# Patient Record
Sex: Female | Born: 1970 | Race: White | Hispanic: No | Marital: Married | Smoking: Former smoker
Health system: Southern US, Community
[De-identification: ages and names within clinical notes are randomized; demographics above are authoritative.]

## PROBLEM LIST (undated history)

## (undated) DIAGNOSIS — N920 Excessive and frequent menstruation with regular cycle: Secondary | ICD-10-CM

## (undated) DIAGNOSIS — K900 Celiac disease: Secondary | ICD-10-CM

## (undated) HISTORY — DX: Celiac disease: K90.0

## (undated) HISTORY — PX: WISDOM TOOTH EXTRACTION: SHX21

---

## 1998-05-22 ENCOUNTER — Other Ambulatory Visit: Admission: RE | Admit: 1998-05-22 | Discharge: 1998-05-22 | Payer: Self-pay | Admitting: Gynecology

## 1999-01-19 ENCOUNTER — Inpatient Hospital Stay (HOSPITAL_COMMUNITY): Admission: AD | Admit: 1999-01-19 | Discharge: 1999-01-21 | Payer: Self-pay | Admitting: Obstetrics & Gynecology

## 1999-01-22 ENCOUNTER — Encounter (HOSPITAL_COMMUNITY): Admission: RE | Admit: 1999-01-22 | Discharge: 1999-04-22 | Payer: Self-pay | Admitting: Obstetrics and Gynecology

## 2000-12-29 ENCOUNTER — Other Ambulatory Visit: Admission: RE | Admit: 2000-12-29 | Discharge: 2000-12-29 | Payer: Self-pay | Admitting: Obstetrics and Gynecology

## 2001-08-18 ENCOUNTER — Encounter: Payer: Self-pay | Admitting: Obstetrics and Gynecology

## 2001-08-18 ENCOUNTER — Inpatient Hospital Stay (HOSPITAL_COMMUNITY): Admission: AD | Admit: 2001-08-18 | Discharge: 2001-08-18 | Payer: Self-pay | Admitting: Obstetrics and Gynecology

## 2001-08-22 ENCOUNTER — Inpatient Hospital Stay (HOSPITAL_COMMUNITY): Admission: AD | Admit: 2001-08-22 | Discharge: 2001-08-24 | Payer: Self-pay | Admitting: Obstetrics and Gynecology

## 2001-09-19 ENCOUNTER — Other Ambulatory Visit: Admission: RE | Admit: 2001-09-19 | Discharge: 2001-09-19 | Payer: Self-pay | Admitting: Obstetrics and Gynecology

## 2003-01-02 ENCOUNTER — Inpatient Hospital Stay (HOSPITAL_COMMUNITY): Admission: AD | Admit: 2003-01-02 | Discharge: 2003-01-02 | Payer: Self-pay | Admitting: Obstetrics and Gynecology

## 2003-01-06 ENCOUNTER — Inpatient Hospital Stay (HOSPITAL_COMMUNITY): Admission: AD | Admit: 2003-01-06 | Discharge: 2003-01-08 | Payer: Self-pay | Admitting: Obstetrics and Gynecology

## 2003-02-14 ENCOUNTER — Other Ambulatory Visit: Admission: RE | Admit: 2003-02-14 | Discharge: 2003-02-14 | Payer: Self-pay | Admitting: Obstetrics and Gynecology

## 2005-03-14 ENCOUNTER — Encounter: Admission: RE | Admit: 2005-03-14 | Discharge: 2005-03-14 | Payer: Self-pay | Admitting: Orthopedic Surgery

## 2006-03-14 ENCOUNTER — Encounter: Admission: RE | Admit: 2006-03-14 | Discharge: 2006-03-14 | Payer: Self-pay | Admitting: Orthopedic Surgery

## 2007-07-25 ENCOUNTER — Encounter: Admission: RE | Admit: 2007-07-25 | Discharge: 2007-07-25 | Payer: Self-pay | Admitting: Sports Medicine

## 2010-09-25 NOTE — Discharge Summary (Signed)
   NAME:  Erica Franco, Erica Franco                     ACCOUNT NO.:  [id number]   MEDICAL RECORD NO.:  [id number]                   PATIENT TYPE:  INP   LOCATION:  9142                                 FACILITY:  [facility]   PHYSICIAN:  Gerrit Friends. Aldona Bar, M.D.                DATE OF BIRTH:  June 23, 1970   DATE OF ADMISSION:  01/06/2003  DATE OF DISCHARGE:                                 DISCHARGE SUMMARY   DISCHARGE DIAGNOSES:  1. Term pregnancy delivered, 7 pound 4 ounce female, Apgars 7 and 9.  2. Blood type O negative - RhoGAM apparently not necessary as husband is     also Rh negative.   PROCEDURES:  1. Induction of labor after successful version.  2. Normal spontaneous delivery.   SUMMARY:  This 40 year old gravida 3 para 2 was admitted on the evening of  January 06, 2003 for induction, having had a successful version from breech  presentation several days earlier.  Her pregnancy was complicated by  gestational thrombocytopenia.  Her blood type is O negative but her husband  is also Rh negative.   At the time of admission her cervix was a loose centimeter dilated, 40%  effaced, with the vertex at -1 station.  Cytotec was placed on the evening  of August 29 and the patient began laboring.  On the morning of August 30  she had a normal spontaneous delivery of a viable female infant weighing 7  pounds 4 ounces over a first degree laceration which was repaired.  There  was meconium noted at the time of delivery.   The patient's postpartum course was uncomplicated.  On the morning of August  31 her hemoglobin was noted to be 11.4 with a white count of 9000 and a  platelet count of 137,000.  She was bottle feeding and the baby and mother  were both doing well.  Discharge was planned for the evening of August 31.  The patient was given all appropriate instructions on the morning of August  31 and understood all instructions well.  Discharge medications are vitamins  one a day and the patient will  use over-the-counter Advil or Motrin as  needed for discomfort.  She will arrange an appointment in the office to  follow up in approximately four weeks time.  She was given a detailed  instruction brochure at the time of discharge and understood all  instructions well.   CONDITION ON DISCHARGE:  Improved.                                               Gerrit Friends. Aldona Bar, M.D.    RMW/MEDQ  D:  01/08/2003  T:  01/08/2003  Job:  [id number]

## 2012-09-18 ENCOUNTER — Encounter: Payer: PRIVATE HEALTH INSURANCE | Attending: Gastroenterology | Admitting: *Deleted

## 2012-09-18 ENCOUNTER — Encounter: Payer: Self-pay | Admitting: *Deleted

## 2012-09-18 DIAGNOSIS — K900 Celiac disease: Secondary | ICD-10-CM | POA: Insufficient documentation

## 2012-09-18 DIAGNOSIS — Z713 Dietary counseling and surveillance: Secondary | ICD-10-CM | POA: Insufficient documentation

## 2012-09-18 NOTE — Progress Notes (Signed)
  Medical Nutrition Therapy:  Appt start time: 1030 end time:  1130.  Assessment:  Primary concerns today: celiac sprue with dermititis. LIves with husband and 3 children, 24-41 years old. She does most of grocery shopping, both prepare the meals. 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 Enjoys travel, sports with the kids.   MEDICATIONS: none   DIETARY INTAKE:  Usual eating pattern includes 2-3 meals and 0-2 snacks per day.  Everyday foods include good variety of all food groups except foods containing gluten.  Avoided foods include gluten containing foods.    24-hr recall:  B ( AM): skips often or eats a yogurt OR granola bar  Snk ( AM): none  L ( PM): goes out for lunch: likes [place] OR [employer] gluten free foods, water or sweet tea or 1/2 and 1/2 Snk ( PM): none now, used to enjoy cake D ( PM): meat, starch and vegetables, water Snk ( PM): occasionally popcorn. Beverages: water, diluted sweet tea  Usual physical activity: has started walking and swimming laps twice a week   Estimated energy needs: 1500 calories 170 g carbohydrates 112 g protein 42 g fat  Progress Towards Goal(s):  In progress.   Nutritional Diagnosis:  NB-1.1 Food and nutrition-related knowledge deficit As related to celiac sprue.  As evidenced by recent diagnosis.    Intervention:  Nutrition counseling provided on foods allowed and avoided for Celiac Disease. Supplemented her resource information with additional lists provided by the [employer].  Handouts given during visit include: The Gluten-Free Diet: An Update for Health Professionals from PRACTICAL GASTROENTEROLOGY  Monitoring/Evaluation:  Dietary intake, exercise, and body weight prn.

## 2013-07-11 ENCOUNTER — Other Ambulatory Visit: Payer: Self-pay

## 2013-07-11 DIAGNOSIS — Z1231 Encounter for screening mammogram for malignant neoplasm of breast: Secondary | ICD-10-CM

## 2013-08-06 ENCOUNTER — Ambulatory Visit
Admission: RE | Admit: 2013-08-06 | Discharge: 2013-08-06 | Disposition: A | Discharge status: Home / Self Care | Payer: Self-pay | Source: Ambulatory Visit

## 2013-08-06 DIAGNOSIS — Z1231 Encounter for screening mammogram for malignant neoplasm of breast: Secondary | ICD-10-CM

## 2013-08-08 ENCOUNTER — Other Ambulatory Visit: Payer: Self-pay | Admitting: Obstetrics and Gynecology

## 2013-08-08 DIAGNOSIS — R928 Other abnormal and inconclusive findings on diagnostic imaging of breast: Secondary | ICD-10-CM

## 2013-08-20 ENCOUNTER — Ambulatory Visit
Admission: RE | Admit: 2013-08-20 | Discharge: 2013-08-20 | Disposition: A | Discharge status: Home / Self Care | Payer: PRIVATE HEALTH INSURANCE | Source: Ambulatory Visit | Attending: Obstetrics and Gynecology | Admitting: Obstetrics and Gynecology

## 2013-08-20 DIAGNOSIS — R928 Other abnormal and inconclusive findings on diagnostic imaging of breast: Secondary | ICD-10-CM

## 2014-04-30 ENCOUNTER — Other Ambulatory Visit: Payer: Self-pay | Admitting: Obstetrics and Gynecology

## 2014-04-30 DIAGNOSIS — N6489 Other specified disorders of breast: Secondary | ICD-10-CM

## 2014-09-17 ENCOUNTER — Other Ambulatory Visit: Payer: Self-pay | Admitting: Obstetrics and Gynecology

## 2014-09-17 DIAGNOSIS — N6489 Other specified disorders of breast: Secondary | ICD-10-CM

## 2014-09-25 ENCOUNTER — Ambulatory Visit
Admission: RE | Admit: 2014-09-25 | Discharge: 2014-09-25 | Disposition: A | Discharge status: Home / Self Care | Payer: PRIVATE HEALTH INSURANCE | Source: Ambulatory Visit | Attending: Obstetrics and Gynecology | Admitting: Obstetrics and Gynecology

## 2014-09-25 ENCOUNTER — Other Ambulatory Visit: Payer: PRIVATE HEALTH INSURANCE

## 2014-09-25 DIAGNOSIS — N6489 Other specified disorders of breast: Secondary | ICD-10-CM

## 2015-11-03 ENCOUNTER — Encounter: Payer: Self-pay | Admitting: Pediatrics

## 2016-04-14 DIAGNOSIS — S86919A Strain of unspecified muscle(s) and tendon(s) at lower leg level, unspecified leg, initial encounter: Secondary | ICD-10-CM | POA: Insufficient documentation

## 2016-04-14 DIAGNOSIS — S86112A Strain of other muscle(s) and tendon(s) of posterior muscle group at lower leg level, left leg, initial encounter: Secondary | ICD-10-CM | POA: Diagnosis not present

## 2017-09-12 DIAGNOSIS — Z1329 Encounter for screening for other suspected endocrine disorder: Secondary | ICD-10-CM | POA: Diagnosis not present

## 2017-09-12 DIAGNOSIS — Z13220 Encounter for screening for lipoid disorders: Secondary | ICD-10-CM | POA: Diagnosis not present

## 2017-09-12 DIAGNOSIS — Z124 Encounter for screening for malignant neoplasm of cervix: Secondary | ICD-10-CM | POA: Diagnosis not present

## 2017-09-12 DIAGNOSIS — Z131 Encounter for screening for diabetes mellitus: Secondary | ICD-10-CM | POA: Diagnosis not present

## 2017-09-12 DIAGNOSIS — Z01419 Encounter for gynecological examination (general) (routine) without abnormal findings: Secondary | ICD-10-CM | POA: Diagnosis not present

## 2017-09-12 DIAGNOSIS — N925 Other specified irregular menstruation: Secondary | ICD-10-CM | POA: Diagnosis not present

## 2017-09-12 DIAGNOSIS — Z1389 Encounter for screening for other disorder: Secondary | ICD-10-CM | POA: Diagnosis not present

## 2017-09-13 ENCOUNTER — Other Ambulatory Visit: Payer: Self-pay | Admitting: Obstetrics and Gynecology

## 2017-09-13 DIAGNOSIS — R922 Inconclusive mammogram: Secondary | ICD-10-CM

## 2017-10-12 ENCOUNTER — Other Ambulatory Visit: Payer: Self-pay | Admitting: Obstetrics and Gynecology

## 2017-10-12 DIAGNOSIS — R922 Inconclusive mammogram: Secondary | ICD-10-CM

## 2017-11-01 ENCOUNTER — Ambulatory Visit
Admission: RE | Admit: 2017-11-01 | Discharge: 2017-11-01 | Disposition: A | Discharge status: Home / Self Care | Payer: 59 | Source: Ambulatory Visit | Attending: Obstetrics and Gynecology | Admitting: Obstetrics and Gynecology

## 2017-11-01 ENCOUNTER — Ambulatory Visit
Admission: RE | Admit: 2017-11-01 | Discharge: 2017-11-01 | Disposition: A | Discharge status: Home / Self Care | Payer: Self-pay | Source: Ambulatory Visit | Attending: Obstetrics and Gynecology | Admitting: Obstetrics and Gynecology

## 2017-11-01 DIAGNOSIS — R922 Inconclusive mammogram: Secondary | ICD-10-CM | POA: Insufficient documentation

## 2018-03-01 DIAGNOSIS — H524 Presbyopia: Secondary | ICD-10-CM | POA: Diagnosis not present

## 2018-03-01 DIAGNOSIS — H5211 Myopia, right eye: Secondary | ICD-10-CM | POA: Diagnosis not present

## 2018-03-01 DIAGNOSIS — H52223 Regular astigmatism, bilateral: Secondary | ICD-10-CM | POA: Diagnosis not present

## 2018-06-26 ENCOUNTER — Encounter: Payer: Self-pay | Admitting: Family Medicine

## 2018-06-26 ENCOUNTER — Ambulatory Visit (INDEPENDENT_AMBULATORY_CARE_PROVIDER_SITE_OTHER): Payer: No Typology Code available for payment source | Admitting: Family Medicine

## 2018-06-26 VITALS — BP 133/84 | HR 74 | Resp 16 | Ht 66.0 in | Wt 166.0 lb

## 2018-06-26 DIAGNOSIS — E663 Overweight: Secondary | ICD-10-CM | POA: Diagnosis not present

## 2018-06-26 DIAGNOSIS — IMO0002 Reserved for concepts with insufficient information to code with codable children: Secondary | ICD-10-CM

## 2018-06-26 DIAGNOSIS — Z7689 Persons encountering health services in other specified circumstances: Secondary | ICD-10-CM

## 2018-06-26 DIAGNOSIS — Z8249 Family history of ischemic heart disease and other diseases of the circulatory system: Secondary | ICD-10-CM

## 2018-06-26 DIAGNOSIS — J3089 Other allergic rhinitis: Secondary | ICD-10-CM

## 2018-06-26 DIAGNOSIS — K900 Celiac disease: Secondary | ICD-10-CM | POA: Diagnosis not present

## 2018-06-26 DIAGNOSIS — Z800 Family history of malignant neoplasm of digestive organs: Secondary | ICD-10-CM

## 2018-06-26 DIAGNOSIS — Z833 Family history of diabetes mellitus: Secondary | ICD-10-CM

## 2018-06-26 NOTE — Patient Instructions (Signed)

## 2018-06-26 NOTE — Progress Notes (Signed)
New patient office visit note:  Impression and Recommendations:    1. Encounter to establish care with new doctor   2. Celiac disease/ gluten sensitivity   3. Body mass index (BMI) of 25.0 to 29.9   4. Environmental and seasonal allergies   5. Family history of diabetes mellitus (DM)   6. Family history of heart attack   7. Family history of colon cancer      1. Encounter to Establish Care with New Doctor - Extensive discussion held with patient regarding establishing as a new patient.  Discussed policies and practices here at the clinic, and answered all questions about care team and health management during appointment.  - Discussed need for patient to continue to obtain management and screenings with all established specialists.  Educated patient at length about the critical importance of keeping health maintenance up to date.  - Participated in lengthy conversation and all questions were answered.  - Need for baseline labs in near future.  2. Blood Pressure - Elevated on Intake - Reviewed that 119/79 over less is normal BP. - Per patient, 120/80 is her usual blood pressure.  - Ongoing ambulatory blood pressure monitoring encouraged. - Will continue to monitor.  3. Heartbeat with Prominent S2 - Advised patient that she may have a functional heart murmur. - Patient knows to keep an eye on her heart. - If her S2 prominence increases, or she notices other sx, she knows to seek further attention PRN.  4. BMI Counseling - BMI of 26.79 - Reviewed that patient is low risk at current BMI.  - Explained to patient what BMI refers to, and what it means medically.    Told patient to think about it as a "medical risk stratification measurement" and how increasing BMI is associated with increasing risk/ or worsening state of various diseases such as hypertension, hyperlipidemia, diabetes, premature OA, depression etc.  American Heart Association guidelines for healthy diet,  basically Mediterranean diet, and exercise guidelines of 30 minutes 5 days per week or more discussed in detail.  Health counseling performed.  All questions answered.  5. Lifestyle & Preventative Health Maintenance - Advised patient to continue working toward exercising to improve overall mental, physical, and emotional health.    - Encouraged patient to engage in daily physical activity, especially a formal exercise routine.  Recommended that the patient eventually strive for at least 150 minutes of moderate cardiovascular activity per week according to guidelines established by the [facility].   - Healthy dietary habits encouraged, including low-carb, and high amounts of lean protein in diet.   - Patient should also consume adequate amounts of water.    Education and routine counseling performed. Handouts provided.   Do not remove these below:  No orders of the defined types were placed in this encounter.   No orders of the defined types were placed in this encounter.   There are no discontinued medications.    Gross side effects, risk and benefits, and alternatives of medications discussed with patient.  Patient is aware that all medications have potential side effects and we are unable to predict every side effect or drug-drug interaction that may occur.  Expresses verbal understanding and consents to current therapy plan and treatment regimen.  Return for CPE/ yrly physical, come fasting near future.  Please see AVS handed out to patient at the end of our visit for further patient instructions/ counseling done pertaining to today's office visit.    Note:  This  document was prepared using Conservation officer, historic buildings and may include unintentional dictation errors.   This document serves as a record of services personally performed by Thomasene Lot, DO. It was created on her behalf by Peggye Fothergill, a trained medical scribe. The creation of this record is based on the  scribe's personal observations and the provider's statements to them.   I have reviewed the above medical documentation for accuracy and completeness and I concur.  Thomasene Lot, DO 06/27/2018 1:15 PM       ----------------------------------------------------------------------------------------------------------------------------------    Subjective:    Chief complaint:   Chief Complaint  Patient presents with  . Establish Care     HPI: Erica Franco is a pleasant 48 y.o. female who presents to [facility] today to review their medical history with me and establish care.   I asked the patient to review their chronic problem list with me to ensure everything was updated and accurate.    All recent office visits with other providers, any medical records that patient brought in etc  - I reviewed today.     We asked pt to get [place] their medical records from [facility] providers/ specialists that they had seen within the past 3-5 years- if they are in private practice and/or do not work for [employer], [facility], [place], [facility] or [facility] owned practice.  Told them to call their specialists to clarify this if they are not sure.    Notes she's never had a doctor before.  Her insurance prompted her to come.  She lives down the road and shops at [employer].  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  Has been with [employer] for 3 years. Has been an MRI tech for 20 years. She likes the profession.  Married to [place] Has 64 kids, 75 year old in college. Two kids still live with them. Happily married for 20 years.  Tobacco Use One pack per week for four years. Notes "it was a social thing."  Alcohol Use No.  Weight & Exercise Habits Patient would like to be a few pounds lighter.  She does not exercise.  Family History Cervical cancer in paternal grandmother, older onset in 74's. Colon cancer in paternal aunt, in her  late 6's, early 1's. Paternal grandfather had a heart attack in older years. Maternal grandmother with older onset diabetes. Maternal grandfather had a stroke, older onset.  Two brothers with alcohol abuse. Denies family history of depression.  Past Medical History Never told she has thyroid issues or Vitamin D Deficiency.  - Celiac Was diagnosed by Dr. Dulce Sellar; never went back after this. Patient doesn't eat gluten and notes she's fine.  - Female Health Managed by Malva Limes of [facility].  - Torn Meniscus States this acts up from time to time, even when she receives a cortisone injection.  Follows up at [facility].  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   Wt Readings from Last 3 Encounters:  06/26/18 166 lb (75.3 kg)  09/30/12 143 lb 8 oz (65.1 kg)   BP Readings from Last 3 Encounters:  06/26/18 133/84   Pulse Readings from Last 3 Encounters:  06/26/18 74   BMI Readings from Last 3 Encounters:  06/26/18 26.79 kg/m  09/30/12 22.48 kg/m  Patient Care Team    Relationship Specialty Notifications Start End  Thomasene Lotpalski, Lissandra Keil, DO PCP - General Family Medicine  06/26/18   Salvatore MarvelWainer, Robert, MD Consulting Physician Orthopedic Surgery  06/26/18   Lorenza BurtonKapec, Paula I, DMD  Dentistry  06/26/18     Patient Active Problem List   Diagnosis Date Noted  . Celiac disease/ gluten sensitivity 06/26/2018  . Body mass index (BMI) of 25.0 to 29.9 06/26/2018  . Environmental and seasonal allergies 06/26/2018  . Family history of colon cancer 06/26/2018  . Family history of heart attack 06/26/2018  . Family history of diabetes mellitus (DM) 06/26/2018       As reported by pt:  Past Medical History:  Diagnosis Date  . Celiac disease   . Celiac disease      Past Surgical History:  Procedure Laterality Date  . WISDOM TOOTH EXTRACTION        Family History  Problem Relation Age of Onset  . Alcohol abuse Brother   . Cancer Paternal Aunt        colon  . Diabetes Maternal Grandmother   . Stroke Maternal Grandfather   . Cancer Paternal Grandmother        Cervical  . Heart attack Paternal Grandfather   . Alcohol abuse Brother   . Breast cancer Neg Hx      Social History   Substance and Sexual Activity  Drug Use Never     Social History   Substance and Sexual Activity  Alcohol Use Never  . Frequency: Never     Social History   Tobacco Use  Smoking Status Former Smoker  . Packs/day: 0.10  . Years: 4.00  . Pack years: 0.40  . Types: Cigarettes  Smokeless Tobacco Never Used     No outpatient medications have been marked as taking for the 06/26/18 encounter (Office Visit) with Thomasene Lotpalski, Merl Bommarito, DO.    Allergies: Patient has no known allergies.   Review of Systems  Constitutional: Negative for chills, diaphoresis, fever, malaise/fatigue and weight loss.  HENT: Negative for congestion, sore throat and tinnitus.   Eyes: Negative for blurred vision, double vision and photophobia.  Respiratory: Negative for cough and wheezing.   Cardiovascular: Negative for chest pain and palpitations.  Gastrointestinal: Negative for blood in stool, diarrhea, nausea and vomiting.  Genitourinary: Negative for dysuria, frequency and urgency.  Musculoskeletal: Positive for joint pain (chronic, torn meniscus) and myalgias (chronic, torn meniscus).  Skin: Negative for itching and rash.  Neurological: Negative for dizziness, focal weakness, weakness and headaches.  Endo/Heme/Allergies: Positive for environmental allergies (chronic hay fever, seasonal allergies). Negative for polydipsia. Does not bruise/bleed easily.  Psychiatric/Behavioral: Negative for depression and memory loss. The patient is not nervous/anxious and does not have insomnia.         Objective:   Blood pressure 133/84, pulse 74, resp. rate 16,  height 5\' 6"  (1.676 m), weight 166 lb (75.3 kg), SpO2 100 %. Body mass index is 26.79 kg/m. General: Well Developed, well nourished, and in no acute distress.  Neuro: Alert and oriented x3, extra-ocular muscles intact, sensation grossly intact.  HEENT:[place]/AT, PERRLA, neck supple, No carotid bruits Skin: no gross rashes  Cardiac: Regular rate and rhythm, prominent S2 Respiratory: Essentially clear to auscultation bilaterally. Not using accessory muscles, speaking in full sentences.  Abdominal: not grossly distended Musculoskeletal: Ambulates w/o diff, FROM * 4 ext.  Vasc: less 2 sec cap RF, warm and pink  Psych:  No HI/SI, judgement and insight good, Euthymic mood. Full  Affect.    No results found for this or any previous visit (from the past 2160 hour(s)).

## 2018-10-31 ENCOUNTER — Other Ambulatory Visit: Payer: Self-pay | Admitting: Obstetrics and Gynecology

## 2018-11-10 ENCOUNTER — Other Ambulatory Visit: Admission: RE | Admit: 2018-11-10 | Payer: No Typology Code available for payment source | Source: Ambulatory Visit

## 2018-11-10 ENCOUNTER — Other Ambulatory Visit: Payer: Self-pay

## 2018-11-10 ENCOUNTER — Other Ambulatory Visit
Admission: RE | Admit: 2018-11-10 | Discharge: 2018-11-10 | Disposition: A | Discharge status: Home / Self Care | Payer: No Typology Code available for payment source | Source: Ambulatory Visit | Attending: Obstetrics and Gynecology | Admitting: Obstetrics and Gynecology

## 2018-11-10 DIAGNOSIS — Z01812 Encounter for preprocedural laboratory examination: Secondary | ICD-10-CM | POA: Insufficient documentation

## 2018-11-10 DIAGNOSIS — Z1159 Encounter for screening for other viral diseases: Secondary | ICD-10-CM | POA: Insufficient documentation

## 2018-11-10 LAB — SARS CORONAVIRUS 2 (TAT 6-24 HRS): SARS Coronavirus 2: NEGATIVE

## 2018-11-13 ENCOUNTER — Other Ambulatory Visit: Payer: Self-pay

## 2018-11-13 ENCOUNTER — Encounter (HOSPITAL_BASED_OUTPATIENT_CLINIC_OR_DEPARTMENT_OTHER): Payer: Self-pay | Admitting: *Deleted

## 2018-11-13 NOTE — Progress Notes (Signed)

## 2018-11-13 NOTE — Progress Notes (Signed)
Spoke w/ pt via phone for pre-op interview.  Npo after mn.  Arrive at 1030.  Needs urine preg. And T&S.  (called and lvm for Erica Franco, or scheduler for dr Ouida Sills, requested second sign orders).  Pt had covid test done 11-10-2018.

## 2018-11-14 ENCOUNTER — Other Ambulatory Visit: Payer: Self-pay | Admitting: Obstetrics and Gynecology

## 2018-11-14 ENCOUNTER — Ambulatory Visit (HOSPITAL_BASED_OUTPATIENT_CLINIC_OR_DEPARTMENT_OTHER)
Admission: RE | Admit: 2018-11-14 | Discharge: 2018-11-14 | Disposition: A | Discharge status: Home / Self Care | Payer: No Typology Code available for payment source | Source: Ambulatory Visit | Attending: Obstetrics and Gynecology | Admitting: Obstetrics and Gynecology

## 2018-11-14 ENCOUNTER — Ambulatory Visit (HOSPITAL_BASED_OUTPATIENT_CLINIC_OR_DEPARTMENT_OTHER): Payer: No Typology Code available for payment source | Admitting: Anesthesiology

## 2018-11-14 ENCOUNTER — Encounter (HOSPITAL_BASED_OUTPATIENT_CLINIC_OR_DEPARTMENT_OTHER): Payer: Self-pay

## 2018-11-14 ENCOUNTER — Encounter (HOSPITAL_BASED_OUTPATIENT_CLINIC_OR_DEPARTMENT_OTHER)
Admission: RE | Disposition: A | Discharge status: Home / Self Care | Payer: Self-pay | Source: Ambulatory Visit | Attending: Obstetrics and Gynecology

## 2018-11-14 DIAGNOSIS — N840 Polyp of corpus uteri: Secondary | ICD-10-CM | POA: Diagnosis not present

## 2018-11-14 DIAGNOSIS — Z87891 Personal history of nicotine dependence: Secondary | ICD-10-CM | POA: Insufficient documentation

## 2018-11-14 DIAGNOSIS — N920 Excessive and frequent menstruation with regular cycle: Secondary | ICD-10-CM | POA: Insufficient documentation

## 2018-11-14 HISTORY — DX: Excessive and frequent menstruation with regular cycle: N92.0

## 2018-11-14 HISTORY — PX: DILITATION & CURRETTAGE/HYSTROSCOPY WITH NOVASURE ABLATION: SHX5568

## 2018-11-14 LAB — HEMOGLOBIN A1C
Hgb A1c MFr Bld: 5.5 % (ref 4.8–5.6)
Mean Plasma Glucose: 111.15 mg/dL

## 2018-11-14 LAB — POCT PREGNANCY, URINE: Preg Test, Ur: NEGATIVE

## 2018-11-14 LAB — LIPID PANEL
Cholesterol: 218 mg/dL — ABNORMAL HIGH (ref 0–200)
HDL: 47 mg/dL (ref >40)
LDL Cholesterol: 160 mg/dL — ABNORMAL HIGH (ref 0–99)
Total CHOL/HDL Ratio: 4.6 RATIO
Triglycerides: 57 mg/dL (ref <150)
VLDL: 11 mg/dL (ref 0–40)

## 2018-11-14 SURGERY — DILATATION & CURETTAGE/HYSTEROSCOPY WITH NOVASURE ABLATION
Anesthesia: General | Site: Vagina

## 2018-11-14 MED ORDER — CEFAZOLIN SODIUM-DEXTROSE 2-4 GM/100ML-% IV SOLN
INTRAVENOUS | Status: AC
  Filled 2018-11-14: qty 100

## 2018-11-14 MED ORDER — DEXAMETHASONE SODIUM PHOSPHATE 10 MG/ML IJ SOLN
INTRAMUSCULAR | Status: DC | PRN
  Administered 2018-11-14: 10 mg via INTRAVENOUS

## 2018-11-14 MED ORDER — CEFAZOLIN SODIUM-DEXTROSE 2-3 GM-%(50ML) IV SOLR
INTRAVENOUS | Status: DC | PRN
  Administered 2018-11-14: 2 g via INTRAVENOUS

## 2018-11-14 MED ORDER — LIDOCAINE 2% (20 MG/ML) 5 ML SYRINGE
INTRAMUSCULAR | Status: DC | PRN
  Administered 2018-11-14: 10 mg via INTRAVENOUS

## 2018-11-14 MED ORDER — KETOROLAC TROMETHAMINE 30 MG/ML IJ SOLN
INTRAMUSCULAR | Status: DC | PRN
  Administered 2018-11-14: 30 mg via INTRAVENOUS

## 2018-11-14 MED ORDER — LIDOCAINE HCL (PF) 1 % IJ SOLN
INTRAMUSCULAR | Status: DC | PRN
  Administered 2018-11-14: 20 mL

## 2018-11-14 MED ORDER — FENTANYL CITRATE (PF) 100 MCG/2ML IJ SOLN
25.0000 ug | INTRAMUSCULAR | Status: DC | PRN
  Filled 2018-11-14: qty 1

## 2018-11-14 MED ORDER — FENTANYL CITRATE (PF) 100 MCG/2ML IJ SOLN
INTRAMUSCULAR | Status: DC | PRN
  Administered 2018-11-14: 50 ug via INTRAVENOUS

## 2018-11-14 MED ORDER — LACTATED RINGERS IV SOLN
INTRAVENOUS | Status: DC
  Administered 2018-11-14: 12:00:00 via INTRAVENOUS
  Filled 2018-11-14: qty 1000

## 2018-11-14 MED ORDER — SODIUM CHLORIDE 0.9 % IR SOLN
Status: DC | PRN
  Administered 2018-11-14: 3000 mL

## 2018-11-14 MED ORDER — ONDANSETRON HCL 4 MG/2ML IJ SOLN
INTRAMUSCULAR | Status: AC
  Filled 2018-11-14: qty 2

## 2018-11-14 MED ORDER — MIDAZOLAM HCL 2 MG/2ML IJ SOLN
INTRAMUSCULAR | Status: AC
  Filled 2018-11-14: qty 2

## 2018-11-14 MED ORDER — FENTANYL CITRATE (PF) 100 MCG/2ML IJ SOLN
INTRAMUSCULAR | Status: AC
  Filled 2018-11-14: qty 2

## 2018-11-14 MED ORDER — MIDAZOLAM HCL 5 MG/5ML IJ SOLN
INTRAMUSCULAR | Status: DC | PRN
  Administered 2018-11-14: 2 mg via INTRAVENOUS

## 2018-11-14 MED ORDER — DEXAMETHASONE SODIUM PHOSPHATE 10 MG/ML IJ SOLN
INTRAMUSCULAR | Status: AC
  Filled 2018-11-14: qty 1

## 2018-11-14 MED ORDER — METOCLOPRAMIDE HCL 5 MG/ML IJ SOLN
10.0000 mg | Freq: Once | INTRAMUSCULAR | Status: DC | PRN
  Filled 2018-11-14: qty 2

## 2018-11-14 MED ORDER — PROPOFOL 10 MG/ML IV BOLUS
INTRAVENOUS | Status: AC
  Filled 2018-11-14: qty 20

## 2018-11-14 MED ORDER — PROPOFOL 10 MG/ML IV BOLUS
INTRAVENOUS | Status: DC | PRN
  Administered 2018-11-14: 170 mg via INTRAVENOUS

## 2018-11-14 MED ORDER — LIDOCAINE 2% (20 MG/ML) 5 ML SYRINGE
INTRAMUSCULAR | Status: AC
  Filled 2018-11-14: qty 5

## 2018-11-14 MED ORDER — KETOROLAC TROMETHAMINE 30 MG/ML IJ SOLN
INTRAMUSCULAR | Status: AC
  Filled 2018-11-14: qty 1

## 2018-11-14 MED ORDER — MEPERIDINE HCL 25 MG/ML IJ SOLN
6.2500 mg | INTRAMUSCULAR | Status: DC | PRN
  Filled 2018-11-14: qty 1

## 2018-11-14 MED ORDER — LACTATED RINGERS IV SOLN
INTRAVENOUS | Status: DC
  Filled 2018-11-14: qty 1000

## 2018-11-14 MED ORDER — IBUPROFEN 800 MG PO TABS: 800.0000 mg | ORAL_TABLET | Freq: Three times a day (TID) | ORAL | 0 refills | Status: DC | PRN

## 2018-11-14 MED ORDER — ONDANSETRON HCL 4 MG/2ML IJ SOLN
INTRAMUSCULAR | Status: DC | PRN
  Administered 2018-11-14: 4 mg via INTRAVENOUS

## 2018-11-14 SURGICAL SUPPLY — 33 items
ABLATOR SURESOUND NOVASURE (ABLATOR) ×1 IMPLANT
BIPOLAR CUTTING LOOP 21FR (ELECTRODE)
CANISTER SUCT 3000ML PPV (MISCELLANEOUS) ×1 IMPLANT
CATH ROBINSON RED A/P 16FR (CATHETERS) ×1 IMPLANT
COVER BACK TABLE 60X90IN (DRAPES) ×1 IMPLANT
COVER WAND RF STERILE (DRAPES) ×3 IMPLANT
DILATOR CANAL MILEX (MISCELLANEOUS) IMPLANT
DRAPE SHEET LG 3/4 BI-LAMINATE (DRAPES) ×1 IMPLANT
DRSG TELFA 3X8 NADH (GAUZE/BANDAGES/DRESSINGS) IMPLANT
ELECT REM PT RETURN 9FT ADLT (ELECTROSURGICAL) ×2
ELECTRODE REM PT RTRN 9FT ADLT (ELECTROSURGICAL) ×1 IMPLANT
GAUZE VASELINE 1X8 (GAUZE/BANDAGES/DRESSINGS) IMPLANT
GLOVE ECLIPSE 7.0 STRL STRAW (GLOVE) ×2 IMPLANT
GOWN STRL REUS W/ TWL LRG LVL3 (GOWN DISPOSABLE) ×1 IMPLANT
GOWN STRL REUS W/ TWL XL LVL3 (GOWN DISPOSABLE) ×1 IMPLANT
GOWN STRL REUS W/TWL LRG LVL3 (GOWN DISPOSABLE) ×2
GOWN STRL REUS W/TWL XL LVL3 (GOWN DISPOSABLE) ×2
KIT PROCEDURE FLUENT (KITS) ×2 IMPLANT
KIT TURNOVER CYSTO (KITS) ×2 IMPLANT
LEGGING LITHOTOMY PAIR STRL (DRAPES) ×2 IMPLANT
LOOP CUTTING BIPOLAR 21FR (ELECTRODE) IMPLANT
MANIFOLD NEPTUNE II (INSTRUMENTS) IMPLANT
NDL SPNL 22GX3.5 QUINCKE BK (NEEDLE) ×1 IMPLANT
NEEDLE SPNL 22GX3.5 QUINCKE BK (NEEDLE) ×2 IMPLANT
PACK BASIN DAY SURGERY FS (CUSTOM PROCEDURE TRAY) ×2 IMPLANT
PAD DRESSING TELFA 3X8 NADH (GAUZE/BANDAGES/DRESSINGS) IMPLANT
PAD OB MATERNITY 4.3X12.25 (PERSONAL CARE ITEMS) ×2 IMPLANT
PAD PREP 24X48 CUFFED NSTRL (MISCELLANEOUS) ×2 IMPLANT
SYR CONTROL 10ML LL (SYRINGE) ×2 IMPLANT
TOWEL OR 17X26 10 PK STRL BLUE (TOWEL DISPOSABLE) ×2 IMPLANT
TRAY DSU PREP LF (CUSTOM PROCEDURE TRAY) ×2 IMPLANT
TUBE CONNECTING 12X1/4 (SUCTIONS) IMPLANT
WATER STERILE IRR 500ML POUR (IV SOLUTION) ×1 IMPLANT

## 2018-11-14 NOTE — Discharge Instructions (Signed)
°  Post Anesthesia Home Care Instructions  Activity: Get plenty of rest for the remainder of the day. A responsible individual must stay with you for 24 hours following the procedure.  For the next 24 hours, DO NOT: -Drive a car -Paediatric nurse -Drink alcoholic beverages -Take any medication unless instructed by your physician -Make any legal decisions or sign important papers.  Meals: Start with liquid foods such as gelatin or soup. Progress to regular foods as tolerated. Avoid greasy, spicy, heavy foods. If nausea and/or vomiting occur, drink only clear liquids until the nausea and/or vomiting subsides. Call your physician if vomiting continues.  Special Instructions/Symptoms: Your throat may feel dry or sore from the anesthesia or the breathing tube placed in your throat during surgery. If this causes discomfort, gargle with warm salt water. The discomfort should disappear within 24 hours.          DISCHARGE INSTRUCTIONS: HYSTEROSCOPY / ENDOMETRIAL ABLATION The following instructions have been prepared to help you care for yourself upon your return home.    May take Ibuprofen after 6:30 pm  May take stool softner while taking narcotic pain medication to prevent constipation.  Drink plenty of water.  Personal hygiene:  Use sanitary pads for vaginal drainage, not tampons.  Shower the day after your procedure.  NO tub baths, pools or Jacuzzis for 2-3 weeks.  Wipe front to back after using the bathroom.  Activity and limitations:  Do NOT drive or operate any equipment for 24 hours. The effects of anesthesia are still present and drowsiness may result.  Do NOT rest in bed all day.  Walking is encouraged.  Walk up and down stairs slowly.  You may resume your normal activity in one to two days or as indicated by your physician. Sexual activity: NO intercourse for at least 2 weeks after the procedure, or as indicated by your Doctor.  Diet: Eat a light meal as  desired this evening. You may resume your usual diet tomorrow.  Return to Work: You may resume your work activities in one to two days or as indicated by Marine scientist.  What to expect after your surgery: Expect to have vaginal bleeding/discharge for 2-3 days and spotting for up to 10 days. It is not unusual to have soreness for up to 1-2 weeks. You may have a slight burning sensation when you urinate for the first day. Mild cramps may continue for a couple of days. You may have a regular period in 2-6 weeks.  Call your doctor for any of the following:  Excessive vaginal bleeding or clotting, saturating and changing one pad every hour.  Inability to urinate 6 hours after discharge from hospital.  Pain not relieved by pain medication.  Fever of 100.4 F or greater.  Unusual vaginal discharge or odor.  Return to office _________________Call for an appointment ___________________

## 2018-11-14 NOTE — Anesthesia Preprocedure Evaluation (Signed)
Anesthesia Evaluation  Patient identified by MRN, date of birth, ID band Patient awake    Reviewed: Allergy & Precautions, NPO status , Patient's Chart, lab work & pertinent test results  Airway Mallampati: II  TM Distance: >3 FB Neck ROM: Full    Dental no notable dental hx.    Pulmonary neg pulmonary ROS, former smoker,    Pulmonary exam normal breath sounds clear to auscultation       Cardiovascular negative cardio ROS Normal cardiovascular exam Rhythm:Regular Rate:Normal     Neuro/Psych negative neurological ROS  negative psych ROS   GI/Hepatic negative GI ROS, Neg liver ROS,   Endo/Other  negative endocrine ROS  Renal/GU negative Renal ROS  negative genitourinary   Musculoskeletal negative musculoskeletal ROS (+)   Abdominal   Peds negative pediatric ROS (+)  Hematology negative hematology ROS (+)   Anesthesia Other Findings   Reproductive/Obstetrics negative OB ROS                             Anesthesia Physical Anesthesia Plan  ASA: I  Anesthesia Plan: General   Post-op Pain Management:    Induction: Intravenous  PONV Risk Score and Plan: 3 and Ondansetron, Dexamethasone, Treatment may vary due to age or medical condition, Scopolamine patch - Pre-op and Midazolam  Airway Management Planned: LMA  Additional Equipment:   Intra-op Plan:   Post-operative Plan:   Informed Consent: I have reviewed the patients History and Physical, chart, labs and discussed the procedure including the risks, benefits and alternatives for the proposed anesthesia with the patient or authorized representative who has indicated his/her understanding and acceptance.     Dental advisory given  Plan Discussed with: CRNA  Anesthesia Plan Comments:         Anesthesia Quick Evaluation

## 2018-11-14 NOTE — H&P (Signed)
Erica Franco is an 48 y.o. white  female [phone] who presents to the OR for a Hysteroscopy/D&C/ablation secondary to menorrhagia. She has a nl pelvic u/s without fibroids or polyps. She has been counseled on the surgery  Past Medical History:  Diagnosis Date  . Celiac disease   . Menorrhagia     Past Surgical History:  Procedure Laterality Date  . NO PAST SURGERIES    . WISDOM TOOTH EXTRACTION  teen    Family History  Problem Relation Age of Onset  . Alcohol abuse Brother   . Cancer Paternal Aunt        colon  . Diabetes Maternal Grandmother   . Stroke Maternal Grandfather   . Cancer Paternal Grandmother        Cervical  . Heart attack Paternal Grandfather   . Alcohol abuse Brother   . Breast cancer Neg Hx    Social History:  reports that she quit smoking about 30 years ago. Her smoking use included cigarettes. She has a 0.40 pack-year smoking history. She has never used smokeless tobacco. She reports that she does not drink alcohol or use drugs.  Allergies:  Allergies  Allergen Reactions  . Gluten Meal Other (See Comments)    No medications prior to admission.       Blood pressure (!) 148/91, pulse 70, temperature 98.1 F (36.7 C), temperature source Oral, resp. rate 16, height 5\' 7"  (1.702 m), weight 74.9 kg, last menstrual period 11/13/2018, SpO2 100 %. General appearance: alert and cooperative Abdomen: soft, non-tender; bowel sounds normal; no masses,  no organomegaly Pelvic: cervix normal in appearance, external genitalia normal, no adnexal masses or tenderness, no cervical motion tenderness, rectovaginal septum normal, uterus normal size, shape, and consistency and vagina normal without discharge Extremities: extremities normal, atraumatic, no cyanosis or edema   No results found for: WBC, HGB, HCT, MCV, PLT Lab Results  Component Value Date   PREGTESTUR NEGATIVE 11/14/2018      Patient Active Problem List   Diagnosis Date Noted  . Celiac  disease/ gluten sensitivity 06/26/2018  . Body mass index (BMI) of 25.0 to 29.9 06/26/2018  . Environmental and seasonal allergies 06/26/2018  . Family history of colon cancer 06/26/2018  . Family history of heart attack 06/26/2018  . Family history of diabetes mellitus (DM) 06/26/2018   Imp/ Menorrhagia Plan/ Proceed with hysteroscopy/D&C/Ablation   ANDERSON,MARK E 11/14/2018, 10:59 AM

## 2018-11-14 NOTE — Anesthesia Procedure Notes (Signed)
Procedure Name: LMA Insertion Date/Time: 11/14/2018 12:11 PM Performed by: Bonney Aid, CRNA Pre-anesthesia Checklist: Patient identified, Emergency Drugs available, Suction available and Patient being monitored Patient Re-evaluated:Patient Re-evaluated prior to induction Oxygen Delivery Method: Circle system utilized Preoxygenation: Pre-oxygenation with 100% oxygen Induction Type: IV induction Ventilation: Mask ventilation without difficulty LMA: LMA inserted LMA Size: 4.0 Number of attempts: 1 Airway Equipment and Method: Bite block Placement Confirmation: positive ETCO2 Tube secured with: Tape Dental Injury: Teeth and Oropharynx as per pre-operative assessment

## 2018-11-14 NOTE — Op Note (Signed)
NAME: UDELL, BLASINGAME MEDICAL RECORD [id number] ACCOUNT [id number] DATE OF BIRTH:[birth year] FACILITY: [facility] LOCATION: [facility] PHYSICIAN:MARK Kathline Magic, MD  OPERATIVE REPORT  DATE OF PROCEDURE:  11/14/2018  PREOPERATIVE DIAGNOSIS:  Menorrhagia.  POSTOPERATIVE DIAGNOSIS:  Menorrhagia.  PROCEDURE PERFORMED: 1.  Hysteroscopy. 2.  Dilation and curettage. 3.  Endometrial ablation with NovaSure device.  SURGEON:  Mark E. Ouida Sills, MD  ANESTHESIA:  Local.  ANTIBIOTICS:  Ancef 2 g.  DRAINS:  Red rubber catheter bladder.  SPECIMENS:  Endometrial curettings to pathology.  COMPLICATIONS:  None.  ESTIMATED BLOOD LOSS:  Minimal.  FLUID DEFICIT:  40 mL.  DESCRIPTION OF PROCEDURE:  The patient was taken to the operating room.  She was placed in the dorsal supine position.  General anesthetic was administered without difficulty.  She was then placed in the dorsal lithotomy position.  She was prepped and  draped in the usual fashion for this procedure.  Her bladder was drained with a red rubber catheter.  Exam under anesthesia revealed a retroverted uterus of normal size and shape.  No adnexal masses.  A sterile speculum was placed in the vagina.  20 mL  of 1% lidocaine was used for paracervical block.  A single-tooth tenaculum was applied to the anterior cervical lip.  The cervix was serially dilated.  The hysteroscope was advanced through the cervix which appeared normal.  On entering the uterine  cavity, there was no evidence of any submucosal fibroids, polyps, or abnormalities.  Both ostia were visualized.  Hysteroscope was then removed.  Sharp curettage was then performed.  Tissue was sent to pathology.  The uterus was sounded to 8 cm.  The  cervical length was 2-1/2.  The NovaSure device was placed into the uterine cavity and opened.  The width was 4 cm.  The device was seated, and then a seal test was performed.  This was passed.  The device was turned on.  The patient  tolerated the  procedure well.  The device was removed.  The patient was extubated and taken to recovery room in stable condition.  She will be discharged home.  She will follow up in the office in 1 month.  She was sent home with ibuprofen to take as needed.  LN/NUANCE  D:11/14/2018 T:11/14/2018 JOB:[id number]

## 2018-11-14 NOTE — Transfer of Care (Signed)
Immediate Anesthesia Transfer of Care Note  Patient: Erica Franco  Procedure(s) Performed: DILATATION & CURETTAGE/HYSTEROSCOPY WITH NOVASURE ABLATION (N/A Vagina )  Patient Location: PACU  Anesthesia Type:General  Level of Consciousness: sedated  Airway & Oxygen Therapy: Patient Spontanous Breathing and Patient connected to nasal cannula oxygen  Post-op Assessment: Report given to RN and Post -op Vital signs reviewed and stable  Post vital signs: Reviewed and stable  Last Vitals: 126/83, 71,14,100%, 98.1 Vitals Value Taken Time  BP    Temp    Pulse    Resp    SpO2      Last Pain:  Vitals:   11/14/18 1048  TempSrc:   PainSc: 0-No pain      Patients Stated Pain Goal: 6 (81/10/31 5945)  Complications: No apparent anesthesia complications

## 2018-11-14 NOTE — Anesthesia Postprocedure Evaluation (Signed)
Anesthesia Post Note  Patient: Erica Franco  Procedure(s) Performed: DILATATION & CURETTAGE/HYSTEROSCOPY WITH NOVASURE ABLATION (N/A Vagina )     Patient location during evaluation: PACU Anesthesia Type: General Level of consciousness: awake and alert Pain management: pain level controlled Vital Signs Assessment: post-procedure vital signs reviewed and stable Respiratory status: spontaneous breathing, nonlabored ventilation, respiratory function stable and patient connected to nasal cannula oxygen Cardiovascular status: blood pressure returned to baseline and stable Postop Assessment: no apparent nausea or vomiting Anesthetic complications: no    Last Vitals:  Vitals:   11/14/18 1245 11/14/18 1300  BP: 134/79 134/78  Pulse: 69 67  Resp: 14 12  Temp:    SpO2: 100% 100%    Last Pain:  Vitals:   11/14/18 1300  TempSrc:   PainSc: 0-No pain                 Montez Hageman

## 2018-11-15 ENCOUNTER — Encounter (HOSPITAL_BASED_OUTPATIENT_CLINIC_OR_DEPARTMENT_OTHER): Payer: Self-pay | Admitting: Obstetrics and Gynecology

## 2018-12-08 IMAGING — MG MM DIGITAL DIAGNOSTIC BILAT W/ TOMO W/ CAD
8 series · 9 of 24 positions shown · non-contrast
Comparison: Previous exam(s).

CLINICAL DATA: Follow-up for probably benign asymmetry seen in the
superior left breast felt to be related to normal fibroglandular
tissue.

EXAM:
DIGITAL DIAGNOSTIC BILATERAL MAMMOGRAM WITH CAD AND TOMO

[L CC synth-2D]
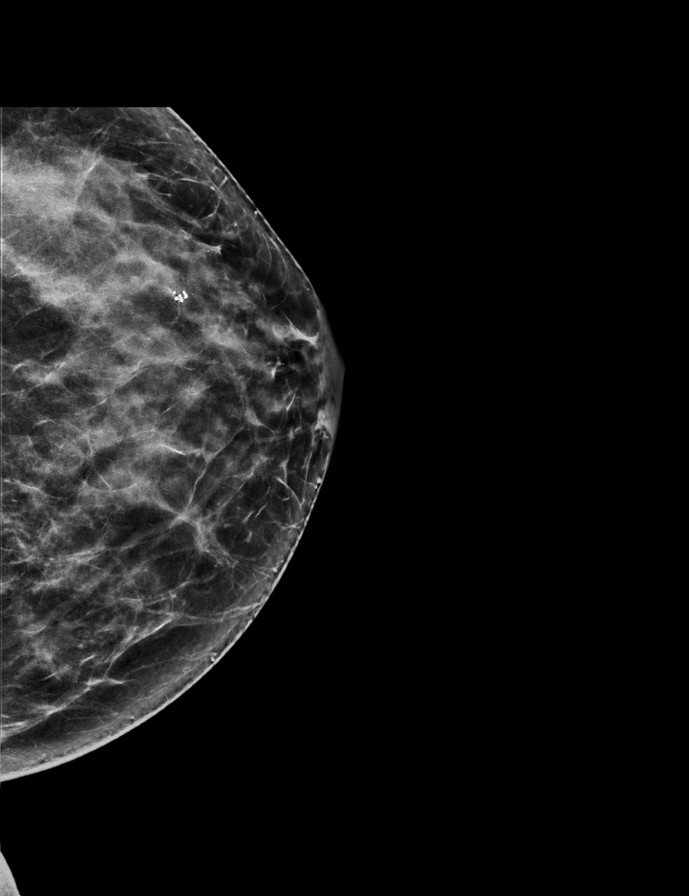

[R MLO synth-2D]
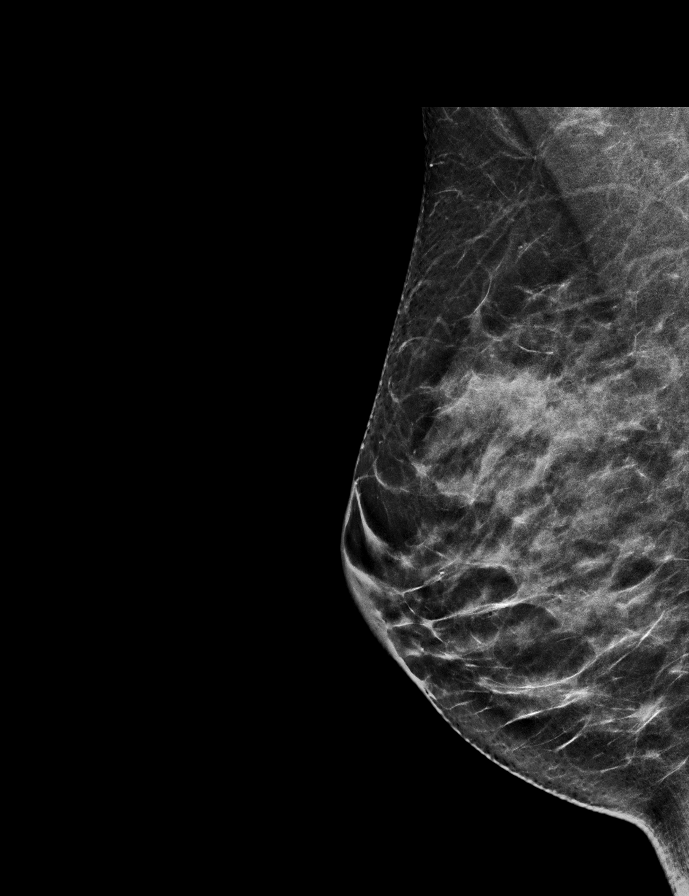

[R CC synth-2D]
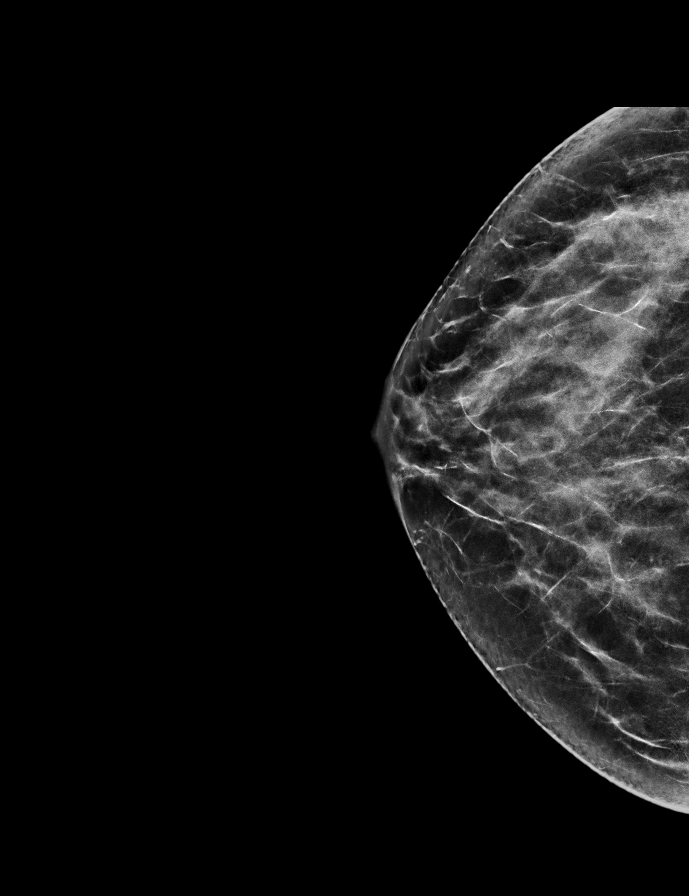

[L MLO synth-2D]
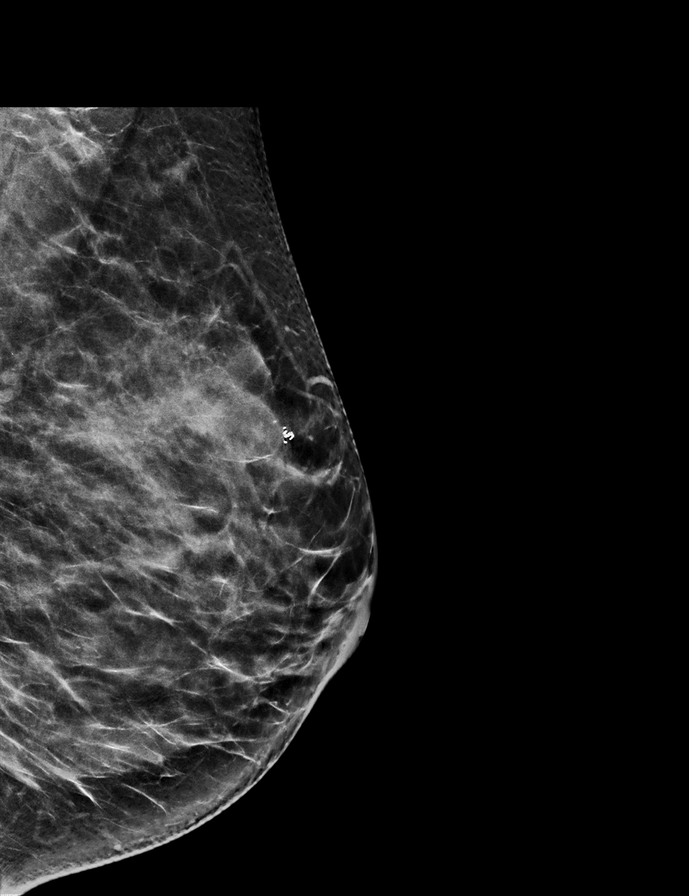

[L MLO tomo · 2 of 65 frames shown]
[frame 21/65]
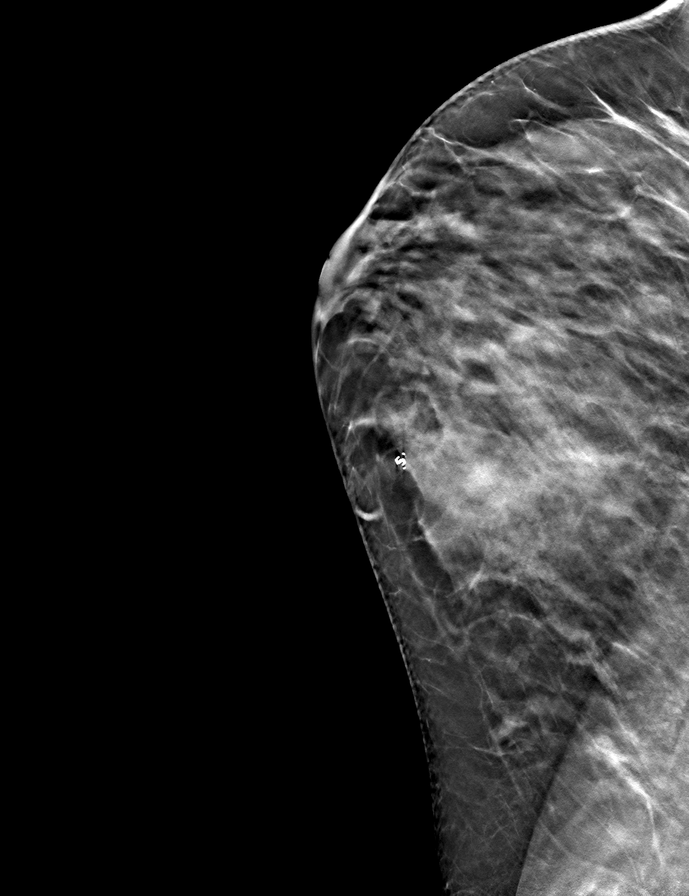
[frame 33/65]
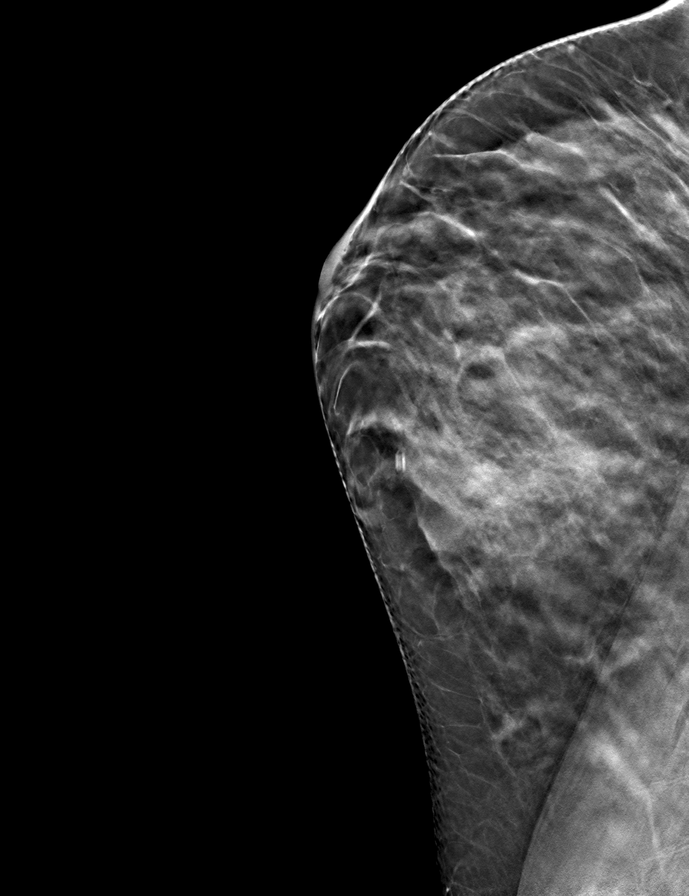

[R MLO tomo · tomo slice 32/63.0]
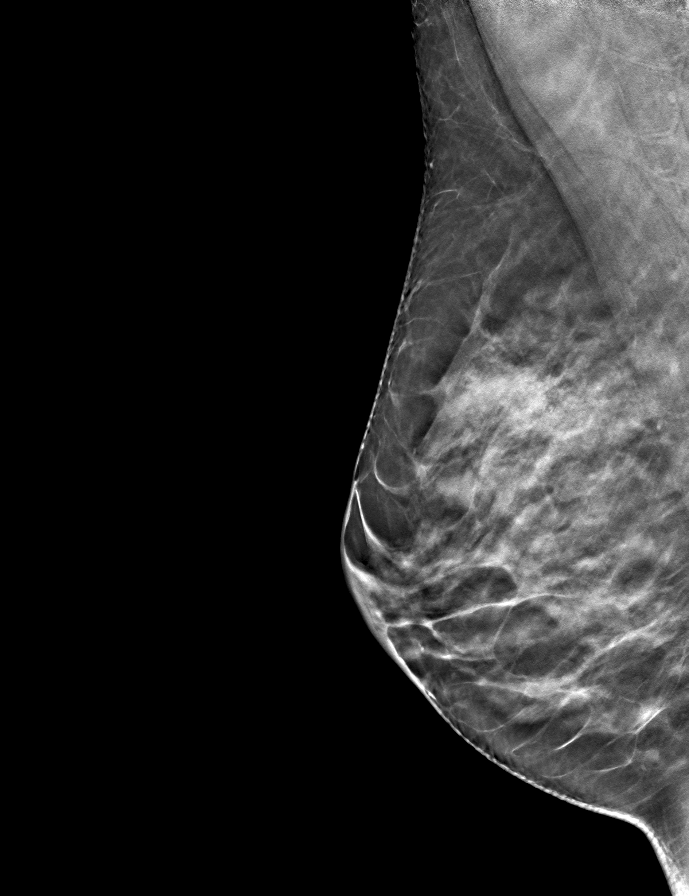

[L CC tomo · tomo slice 33/65.0]
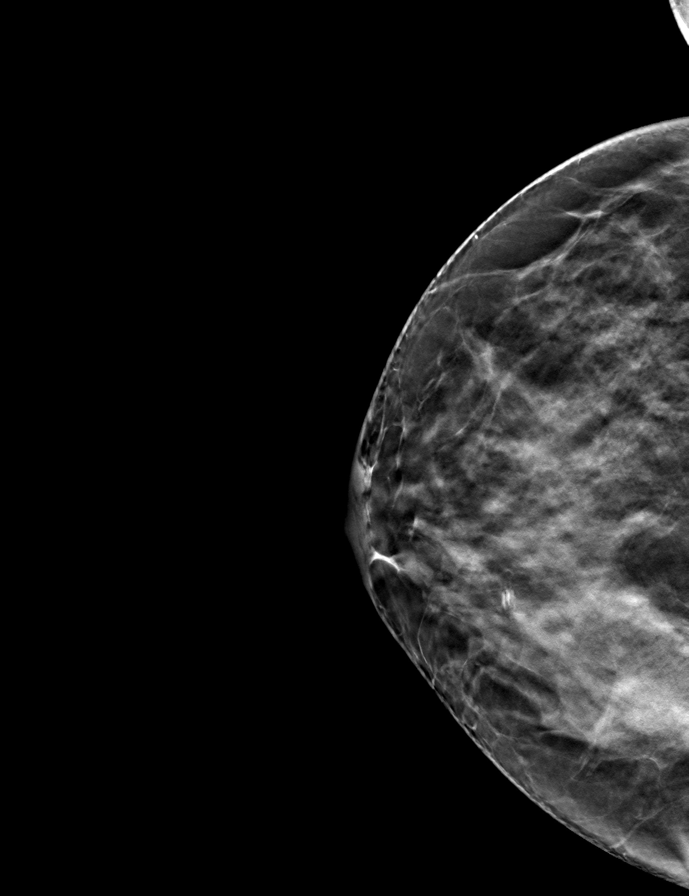

[R CC tomo · tomo slice 31/62.0]
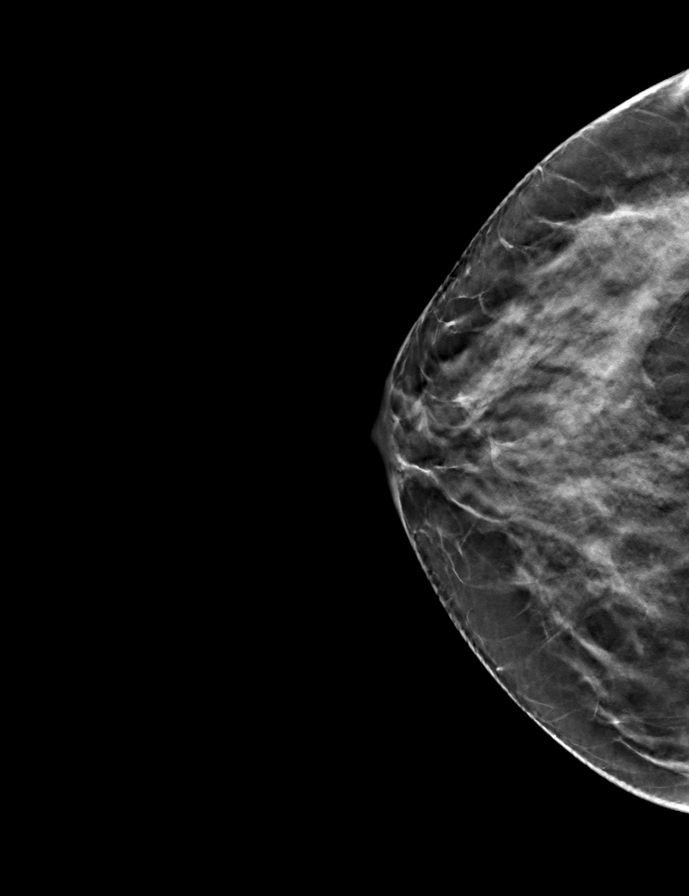

[9 of 24 positions shown; findings below may reference images not displayed]

ACR Breast Density Category c: The breast tissue is heterogeneously
dense, which may obscure small masses.
FINDINGS: No suspicious masses or calcifications are seen in either breast.
The asymmetric fibroglandular tissue seen in the far superior
posterior left breast on the MLO view only appears unchanged dating
back to 9361 and is considered benign. There is no mammographic
evidence of malignancy in either breast.

Mammographic images were processed with CAD.
IMPRESSION: No mammographic evidence of malignancy in either breast.

RECOMMENDATION:
Screening mammogram in one year.(Code:[id number])

I have discussed the findings and recommendations with the patient.
Results were also provided in writing at the conclusion of the
visit. If applicable, a reminder letter will be sent to the patient
regarding the next appointment.

BI-RADS CATEGORY  2: Benign.

## 2019-02-20 ENCOUNTER — Other Ambulatory Visit: Payer: Self-pay | Admitting: Surgery

## 2019-03-30 ENCOUNTER — Telehealth: Payer: Self-pay | Admitting: Family Medicine

## 2019-03-30 NOTE — Telephone Encounter (Signed)
Patient called states she needs to Appeal a [employer] denial for a GYN visit for a gyn issue--- GYN provider dx a problem that requires a Surgical procedure & that has been scheduled--- Patient now needs a referral to that surgeon in place so procedure will not be denied by Ins [employer]/ ([employer]/ [employer])  --glh

## 2019-04-02 NOTE — Telephone Encounter (Signed)
Dr. Raliegh Scarlet has only seen this patient 1 time back in 2/20 for new patient apt and she was supposed to follow up for annual exam (in near future) per Dr. Jenetta Downer notes. Patient has no scheduled apt with our office.   Per Dr. Raliegh Scarlet- patient needs to be seen for CPE and to discuss issue before a referral can be placed.   I have attempted to contact patient to schedule and was unable to reach. Can you please contact patient. AS, CMA

## 2019-05-08 ENCOUNTER — Encounter (HOSPITAL_BASED_OUTPATIENT_CLINIC_OR_DEPARTMENT_OTHER): Payer: Self-pay | Admitting: Surgery

## 2019-05-08 ENCOUNTER — Other Ambulatory Visit: Payer: Self-pay

## 2019-05-10 NOTE — Progress Notes (Signed)

## 2019-05-12 ENCOUNTER — Inpatient Hospital Stay (HOSPITAL_COMMUNITY): Admission: RE | Admit: 2019-05-12 | Payer: No Typology Code available for payment source | Source: Ambulatory Visit

## 2019-05-14 ENCOUNTER — Other Ambulatory Visit (HOSPITAL_COMMUNITY)
Admission: RE | Admit: 2019-05-14 | Discharge: 2019-05-14 | Disposition: A | Discharge status: Home / Self Care | Payer: No Typology Code available for payment source | Source: Ambulatory Visit | Attending: Surgery | Admitting: Surgery

## 2019-05-14 DIAGNOSIS — Z01812 Encounter for preprocedural laboratory examination: Secondary | ICD-10-CM | POA: Diagnosis present

## 2019-05-14 DIAGNOSIS — Z20822 Contact with and (suspected) exposure to covid-19: Secondary | ICD-10-CM | POA: Diagnosis not present

## 2019-05-14 LAB — SARS CORONAVIRUS 2 (TAT 6-24 HRS): SARS Coronavirus 2: NEGATIVE

## 2019-05-15 NOTE — H&P (Signed)
Erica Franco  Location: [facility] Surgery Patient #: [id number] DOB: 1970-10-17 Married / Language: English / Race: White Female   History of Present Illness   The patient is a 49 year old female who presents with a complaint of Breast problems. This is a pleasant 49 year old female referred by Dr. Malva Limes for evaluation of bilateral axillary masses. She reports having these masses for several years. They have been slowly increasing in size. They've become much larger and cause more discomfort during her menstrual cycle and during pregnancies. Her mammograms have been unremarkable except for some extra tissue in the tail of Spence. She denies nipple discharge. The pain to be moderate in intensity. There is no family history of breast cancer.   Past Surgical History No pertinent past surgical history   Diagnostic Studies History Colonoscopy  never Pap Smear  1-5 years ago  Allergies  No Known Drug Allergies   Medication History  No Current Medications Medications Reconciled  Social History Alcohol use  Occasional alcohol use. Caffeine use  Tea. No drug use  Tobacco use  Former smoker.  Family History Alcohol Abuse  Brother. Anesthetic complications  Family Members In General. Cerebrovascular Accident  Family Members In General. Cervical Cancer  Family Members In General. Colon Cancer  Family Members In General. Diabetes Mellitus  Family Members In General. Seizure disorder  Daughter. Thyroid problems  Family Members In General.  Pregnancy / Birth History  Age at menarche  13 years. Gravida  5 Maternal age  89-30 Regular periods   Other Problems  Other disease, cancer, significant illness     Review of Systems General Not Present- Appetite Loss, Chills, Fatigue, Fever, Night Sweats, Weight Gain and Weight Loss. Skin Not Present- Change in Wart/Mole, Dryness, Hives, Jaundice, New Lesions, Non-Healing Wounds, Rash and  Ulcer. HEENT Present- Seasonal Allergies. Not Present- Earache, Hearing Loss, Hoarseness, Nose Bleed, Oral Ulcers, Ringing in the Ears, Sinus Pain, Sore Throat, Visual Disturbances, Wears glasses/contact lenses and Yellow Eyes. Breast Present- Breast Pain. Not Present- Breast Mass, Nipple Discharge and Skin Changes. Cardiovascular Not Present- Chest Pain, Difficulty Breathing Lying Down, Leg Cramps, Palpitations, Rapid Heart Rate, Shortness of Breath and Swelling of Extremities. Gastrointestinal Not Present- Abdominal Pain, Bloating, Bloody Stool, Change in Bowel Habits, Chronic diarrhea, Constipation, Difficulty Swallowing, Excessive gas, Gets full quickly at meals, Hemorrhoids, Indigestion, Nausea, Rectal Pain and Vomiting. Female Genitourinary Not Present- Frequency, Nocturia, Painful Urination, Pelvic Pain and Urgency. Neurological Not Present- Decreased Memory, Fainting, Headaches, Numbness, Seizures, Tingling, Tremor, Trouble walking and Weakness. Psychiatric Not Present- Anxiety, Bipolar, Change in Sleep Pattern, Depression, Fearful and Frequent crying. Endocrine Present- Hair Changes. Not Present- Cold Intolerance, Excessive Hunger, Heat Intolerance, Hot flashes and New Diabetes. Hematology Not Present- Blood Thinners, Easy Bruising, Excessive bleeding, Gland problems, HIV and Persistent Infections.  Vitals   Weight: 169.2 lb Height: 67in Body Surface Area: 1.88 m Body Mass Index: 26.5 kg/m  Pulse: 80 (Regular)  BP: 110/70 (Sitting, Left Arm, Standard)    Physical Exam The physical exam findings are as follows: Note:On physical examination, she has bilateral 5 cm soft, superficial masses in her axilla. There is no deep axillary adenopathy. This has a consistency of accessory breast tissue. Lungs clear CV RRR Abdomen soft, NT    Assessment & Plan  AXILLARY MASS, BILATERAL (R22.33)  Impression: I discussed the diagnosis with her in detail. I suspect this may be  accessory breast tissue. As it is getting larger, causing more discomfort, and has been  difficult to evaluate with mammography, surgical excision of both these areas is recommended for complete histologic evaluation and to improve her symptoms. I discussed the surgical procedure in detail. I discussed the risks includes but is not limited to bleeding, infection, injury to surrounding structures, recurrence, cardiopulmonary issues, postoperative recovery, etc. She understands and agrees to proceed with surgery

## 2019-05-15 NOTE — Anesthesia Preprocedure Evaluation (Addendum)
Anesthesia Evaluation  Patient identified by MRN, date of birth, ID band Patient awake    Reviewed: Allergy & Precautions, NPO status , Patient's Chart, lab work & pertinent test results  Airway Mallampati: I       Dental no notable dental hx. (+) Teeth Intact   Pulmonary former smoker,    Pulmonary exam normal breath sounds clear to auscultation       Cardiovascular negative cardio ROS Normal cardiovascular exam Rhythm:Regular Rate:Normal     Neuro/Psych negative neurological ROS  negative psych ROS   GI/Hepatic negative GI ROS, Neg liver ROS,   Endo/Other  negative endocrine ROS  Renal/GU negative Renal ROS     Musculoskeletal negative musculoskeletal ROS (+)   Abdominal Normal abdominal exam  (+)   Peds  Hematology negative hematology ROS (+)   Anesthesia Other Findings   Reproductive/Obstetrics negative OB ROS                            Anesthesia Physical Anesthesia Plan  ASA: I  Anesthesia Plan: General   Post-op Pain Management:    Induction: Intravenous  PONV Risk Score and Plan:   Airway Management Planned: LMA  Additional Equipment: None  Intra-op Plan:   Post-operative Plan: Extubation in OR  Informed Consent: I have reviewed the patients History and Physical, chart, labs and discussed the procedure including the risks, benefits and alternatives for the proposed anesthesia with the patient or authorized representative who has indicated his/her understanding and acceptance.       Plan Discussed with: CRNA  Anesthesia Plan Comments:        Anesthesia Quick Evaluation

## 2019-05-16 ENCOUNTER — Encounter (HOSPITAL_BASED_OUTPATIENT_CLINIC_OR_DEPARTMENT_OTHER)
Admission: RE | Disposition: A | Discharge status: Home / Self Care | Payer: Self-pay | Source: Home / Self Care | Attending: Surgery

## 2019-05-16 ENCOUNTER — Ambulatory Visit (HOSPITAL_BASED_OUTPATIENT_CLINIC_OR_DEPARTMENT_OTHER)
Admission: RE | Admit: 2019-05-16 | Discharge: 2019-05-16 | Disposition: A | Discharge status: Home / Self Care | Payer: No Typology Code available for payment source | Attending: Surgery | Admitting: Surgery

## 2019-05-16 ENCOUNTER — Other Ambulatory Visit: Payer: Self-pay

## 2019-05-16 ENCOUNTER — Ambulatory Visit (HOSPITAL_BASED_OUTPATIENT_CLINIC_OR_DEPARTMENT_OTHER): Payer: No Typology Code available for payment source | Admitting: Anesthesiology

## 2019-05-16 ENCOUNTER — Encounter (HOSPITAL_BASED_OUTPATIENT_CLINIC_OR_DEPARTMENT_OTHER): Payer: Self-pay | Admitting: Surgery

## 2019-05-16 DIAGNOSIS — R222 Localized swelling, mass and lump, trunk: Secondary | ICD-10-CM | POA: Diagnosis present

## 2019-05-16 DIAGNOSIS — N6489 Other specified disorders of breast: Secondary | ICD-10-CM | POA: Diagnosis not present

## 2019-05-16 DIAGNOSIS — Z87891 Personal history of nicotine dependence: Secondary | ICD-10-CM | POA: Insufficient documentation

## 2019-05-16 HISTORY — PX: MASS EXCISION: SHX2000

## 2019-05-16 LAB — POCT PREGNANCY, URINE: Preg Test, Ur: NEGATIVE

## 2019-05-16 SURGERY — EXCISION MASS
Anesthesia: General | Site: Axilla | Laterality: Bilateral

## 2019-05-16 MED ORDER — MEPERIDINE HCL 25 MG/ML IJ SOLN: 6.2500 mg | INTRAMUSCULAR | Status: DC | PRN

## 2019-05-16 MED ORDER — GABAPENTIN 300 MG PO CAPS
300.0000 mg | ORAL_CAPSULE | ORAL | Status: AC
  Administered 2019-05-16: 08:00:00 300 mg via ORAL

## 2019-05-16 MED ORDER — GABAPENTIN 300 MG PO CAPS
ORAL_CAPSULE | ORAL | Status: AC
  Filled 2019-05-16: qty 1

## 2019-05-16 MED ORDER — FENTANYL CITRATE (PF) 100 MCG/2ML IJ SOLN: 25.0000 ug | INTRAMUSCULAR | Status: DC | PRN

## 2019-05-16 MED ORDER — BUPIVACAINE HCL (PF) 0.25 % IJ SOLN
INTRAMUSCULAR | Status: AC
  Filled 2019-05-16: qty 30

## 2019-05-16 MED ORDER — KETOROLAC TROMETHAMINE 30 MG/ML IJ SOLN
INTRAMUSCULAR | Status: AC
  Filled 2019-05-16: qty 1

## 2019-05-16 MED ORDER — ONDANSETRON HCL 4 MG/2ML IJ SOLN
INTRAMUSCULAR | Status: DC | PRN
  Administered 2019-05-16: 4 mg via INTRAVENOUS

## 2019-05-16 MED ORDER — SODIUM BICARBONATE 4.2 % IV SOLN
INTRAVENOUS | Status: AC
  Filled 2019-05-16: qty 10

## 2019-05-16 MED ORDER — OXYCODONE HCL 5 MG PO TABS: 5.0000 mg | ORAL_TABLET | Freq: Four times a day (QID) | ORAL | 0 refills | Status: DC | PRN

## 2019-05-16 MED ORDER — ONDANSETRON HCL 4 MG/2ML IJ SOLN: 4.0000 mg | Freq: Once | INTRAMUSCULAR | Status: DC | PRN

## 2019-05-16 MED ORDER — CEFAZOLIN SODIUM-DEXTROSE 2-4 GM/100ML-% IV SOLN
2.0000 g | INTRAVENOUS | Status: AC
  Administered 2019-05-16: 2 g via INTRAVENOUS

## 2019-05-16 MED ORDER — CEFAZOLIN SODIUM-DEXTROSE 2-4 GM/100ML-% IV SOLN
INTRAVENOUS | Status: AC
  Filled 2019-05-16: qty 100

## 2019-05-16 MED ORDER — LIDOCAINE HCL (CARDIAC) PF 100 MG/5ML IV SOSY
PREFILLED_SYRINGE | INTRAVENOUS | Status: DC | PRN
  Administered 2019-05-16: 100 mg via INTRAVENOUS

## 2019-05-16 MED ORDER — DEXAMETHASONE SODIUM PHOSPHATE 10 MG/ML IJ SOLN
INTRAMUSCULAR | Status: DC | PRN
  Administered 2019-05-16: 5 mg via INTRAVENOUS

## 2019-05-16 MED ORDER — CELECOXIB 200 MG PO CAPS
ORAL_CAPSULE | ORAL | Status: AC
  Filled 2019-05-16: qty 1

## 2019-05-16 MED ORDER — KETOROLAC TROMETHAMINE 30 MG/ML IJ SOLN: 30.0000 mg | Freq: Once | INTRAMUSCULAR | Status: DC | PRN

## 2019-05-16 MED ORDER — FENTANYL CITRATE (PF) 100 MCG/2ML IJ SOLN
INTRAMUSCULAR | Status: DC | PRN
  Administered 2019-05-16: 100 ug via INTRAVENOUS

## 2019-05-16 MED ORDER — CHLORHEXIDINE GLUCONATE CLOTH 2 % EX PADS: 6.0000 | MEDICATED_PAD | Freq: Once | CUTANEOUS | Status: DC

## 2019-05-16 MED ORDER — LACTATED RINGERS IV SOLN: INTRAVENOUS | Status: DC

## 2019-05-16 MED ORDER — CELECOXIB 200 MG PO CAPS
200.0000 mg | ORAL_CAPSULE | ORAL | Status: AC
  Administered 2019-05-16: 200 mg via ORAL

## 2019-05-16 MED ORDER — BUPIVACAINE HCL (PF) 0.25 % IJ SOLN
INTRAMUSCULAR | Status: DC | PRN
  Administered 2019-05-16: 20 mL

## 2019-05-16 MED ORDER — ONDANSETRON HCL 4 MG/2ML IJ SOLN
INTRAMUSCULAR | Status: AC
  Filled 2019-05-16: qty 2

## 2019-05-16 MED ORDER — KETOROLAC TROMETHAMINE 30 MG/ML IJ SOLN
INTRAMUSCULAR | Status: DC | PRN
  Administered 2019-05-16: 30 mg via INTRAVENOUS

## 2019-05-16 MED ORDER — PROPOFOL 10 MG/ML IV BOLUS
INTRAVENOUS | Status: DC | PRN
  Administered 2019-05-16: 150 mg via INTRAVENOUS

## 2019-05-16 MED ORDER — OXYCODONE HCL 5 MG/5ML PO SOLN: 5.0000 mg | Freq: Once | ORAL | Status: DC | PRN

## 2019-05-16 MED ORDER — DEXAMETHASONE SODIUM PHOSPHATE 10 MG/ML IJ SOLN
INTRAMUSCULAR | Status: AC
  Filled 2019-05-16: qty 1

## 2019-05-16 MED ORDER — LIDOCAINE HCL (PF) 1 % IJ SOLN
INTRAMUSCULAR | Status: AC
  Filled 2019-05-16: qty 30

## 2019-05-16 MED ORDER — ACETAMINOPHEN 160 MG/5ML PO SOLN: 325.0000 mg | ORAL | Status: DC | PRN

## 2019-05-16 MED ORDER — MIDAZOLAM HCL 5 MG/5ML IJ SOLN
INTRAMUSCULAR | Status: DC | PRN
  Administered 2019-05-16: 2 mg via INTRAVENOUS

## 2019-05-16 MED ORDER — OXYCODONE HCL 5 MG PO TABS: 5.0000 mg | ORAL_TABLET | Freq: Once | ORAL | Status: DC | PRN

## 2019-05-16 MED ORDER — BUPIVACAINE HCL (PF) 0.5 % IJ SOLN
INTRAMUSCULAR | Status: AC
  Filled 2019-05-16: qty 30

## 2019-05-16 MED ORDER — ACETAMINOPHEN 325 MG PO TABS: 325.0000 mg | ORAL_TABLET | ORAL | Status: DC | PRN

## 2019-05-16 MED ORDER — ACETAMINOPHEN 500 MG PO TABS
ORAL_TABLET | ORAL | Status: AC
  Filled 2019-05-16: qty 2

## 2019-05-16 MED ORDER — MIDAZOLAM HCL 2 MG/2ML IJ SOLN
INTRAMUSCULAR | Status: AC
  Filled 2019-05-16: qty 2

## 2019-05-16 MED ORDER — FENTANYL CITRATE (PF) 100 MCG/2ML IJ SOLN
INTRAMUSCULAR | Status: AC
  Filled 2019-05-16: qty 2

## 2019-05-16 MED ORDER — ACETAMINOPHEN 500 MG PO TABS
1000.0000 mg | ORAL_TABLET | ORAL | Status: AC
  Administered 2019-05-16: 1000 mg via ORAL

## 2019-05-16 SURGICAL SUPPLY — 44 items
ADH SKN CLS APL DERMABOND .7 (GAUZE/BANDAGES/DRESSINGS) ×2
APL PRP STRL LF DISP 70% ISPRP (MISCELLANEOUS) ×1
BLADE CLIPPER SURG (BLADE) IMPLANT
BLADE HEX COATED 2.75 (ELECTRODE) ×2 IMPLANT
BLADE SURG 15 STRL LF DISP TIS (BLADE) ×1 IMPLANT
BLADE SURG 15 STRL SS (BLADE) ×2
CANISTER SUCT 1200ML W/VALVE (MISCELLANEOUS) IMPLANT
CHLORAPREP W/TINT 26 (MISCELLANEOUS) ×2 IMPLANT
COVER BACK TABLE REUSABLE LG (DRAPES) ×2 IMPLANT
COVER MAYO STAND REUSABLE (DRAPES) ×2 IMPLANT
COVER WAND RF STERILE (DRAPES) IMPLANT
DECANTER SPIKE VIAL GLASS SM (MISCELLANEOUS) IMPLANT
DERMABOND ADVANCED (GAUZE/BANDAGES/DRESSINGS) ×2
DERMABOND ADVANCED .7 DNX12 (GAUZE/BANDAGES/DRESSINGS) ×2 IMPLANT
DRAPE LAPAROTOMY 100X72 PEDS (DRAPES) ×2 IMPLANT
DRAPE UTILITY XL STRL (DRAPES) ×2 IMPLANT
ELECT REM PT RETURN 9FT ADLT (ELECTROSURGICAL) ×2
ELECTRODE REM PT RTRN 9FT ADLT (ELECTROSURGICAL) ×1 IMPLANT
GLOVE BIOGEL PI IND STRL 6.5 (GLOVE) IMPLANT
GLOVE BIOGEL PI INDICATOR 6.5 (GLOVE) ×1
GLOVE ECLIPSE 6.5 STRL STRAW (GLOVE) ×1 IMPLANT
GLOVE EXAM NITRILE MD LF STRL (GLOVE) ×1 IMPLANT
GLOVE SURG SIGNA 7.5 PF LTX (GLOVE) ×2 IMPLANT
GOWN STRL REUS W/ TWL LRG LVL3 (GOWN DISPOSABLE) ×1 IMPLANT
GOWN STRL REUS W/ TWL XL LVL3 (GOWN DISPOSABLE) ×1 IMPLANT
GOWN STRL REUS W/TWL LRG LVL3 (GOWN DISPOSABLE) ×2
GOWN STRL REUS W/TWL XL LVL3 (GOWN DISPOSABLE) ×2
NDL HYPO 25X1 1.5 SAFETY (NEEDLE) ×1 IMPLANT
NEEDLE HYPO 25X1 1.5 SAFETY (NEEDLE) ×2 IMPLANT
NS IRRIG 1000ML POUR BTL (IV SOLUTION) ×1 IMPLANT
PACK BASIN DAY SURGERY FS (CUSTOM PROCEDURE TRAY) ×2 IMPLANT
PENCIL SMOKE EVACUATOR (MISCELLANEOUS) ×2 IMPLANT
SLEEVE SCD COMPRESS KNEE MED (MISCELLANEOUS) ×1 IMPLANT
SPONGE LAP 4X18 RFD (DISPOSABLE) ×3 IMPLANT
SUT MNCRL AB 4-0 PS2 18 (SUTURE) ×2 IMPLANT
SUT VIC AB 2-0 SH 27 (SUTURE)
SUT VIC AB 2-0 SH 27XBRD (SUTURE) IMPLANT
SUT VIC AB 3-0 SH 27 (SUTURE) ×4
SUT VIC AB 3-0 SH 27X BRD (SUTURE) IMPLANT
SYR BULB 3OZ (MISCELLANEOUS) IMPLANT
SYR CONTROL 10ML LL (SYRINGE) ×2 IMPLANT
TOWEL GREEN STERILE FF (TOWEL DISPOSABLE) ×3 IMPLANT
TUBE CONNECTING 20X1/4 (TUBING) IMPLANT
YANKAUER SUCT BULB TIP NO VENT (SUCTIONS) IMPLANT

## 2019-05-16 NOTE — Transfer of Care (Signed)
Immediate Anesthesia Transfer of Care Note  Patient: Erica Franco  Procedure(s) Performed: EXCISION BILATERAL AXILLARY MASSES (Bilateral Axilla)  Patient Location: PACU  Anesthesia Type:General  Level of Consciousness: awake, alert , oriented and patient cooperative  Airway & Oxygen Therapy: Patient Spontanous Breathing and Patient connected to face mask oxygen  Post-op Assessment: Report given to RN and Post -op Vital signs reviewed and stable  Post vital signs: Reviewed and stable  Last Vitals:  Vitals Value Taken Time  BP 127/77 05/16/19 0921  Temp    Pulse 70 05/16/19 0922  Resp 13 05/16/19 0922  SpO2 100 % 05/16/19 0922  Vitals shown include unvalidated device data.  Last Pain:  Vitals:   05/16/19 0713  TempSrc: Temporal  PainSc: 0-No pain      Patients Stated Pain Goal: 4 (05/16/19 9767)  Complications: No apparent anesthesia complications

## 2019-05-16 NOTE — Discharge Instructions (Signed)
Ok to shower starting tomorrow  No vigorous activity for 1 to 2 weeks  You may use Tylenol, ibuprofen, and an ice pack also for pain.  Please call our office at [phone] to set up your follow-up appointment and for any problems.     Post Anesthesia Home Care Instructions  Activity: Get plenty of rest for the remainder of the day. A responsible individual must stay with you for 24 hours following the procedure.  For the next 24 hours, DO NOT: -Drive a car -Advertising copywriter -Drink alcoholic beverages -Take any medication unless instructed by your physician -Make any legal decisions or sign important papers.  Meals: Start with liquid foods such as gelatin or soup. Progress to regular foods as tolerated. Avoid greasy, spicy, heavy foods. If nausea and/or vomiting occur, drink only clear liquids until the nausea and/or vomiting subsides. Call your physician if vomiting continues.  Special Instructions/Symptoms: Your throat may feel dry or sore from the anesthesia or the breathing tube placed in your throat during surgery. If this causes discomfort, gargle with warm salt water. The discomfort should disappear within 24 hours.  If you had a scopolamine patch placed behind your ear for the management of post- operative nausea and/or vomiting:  1. The medication in the patch is effective for 72 hours, after which it should be removed.  Wrap patch in a tissue and discard in the trash. Wash hands thoroughly with soap and water. 2. You may remove the patch earlier than 72 hours if you experience unpleasant side effects which may include dry mouth, dizziness or visual disturbances. 3. Avoid touching the patch. Wash your hands with soap and water after contact with the patch.

## 2019-05-16 NOTE — Op Note (Signed)
EXCISION BILATERAL AXILLARY MASSES  Procedure Note  Erica Franco 05/16/2019   Pre-op Diagnosis: BILATERAL AXILLARY MASSES     Post-op Diagnosis: same  Procedure(s): EXCISION BILATERAL AXILLARY MASSES ( 5 cm each)  Surgeon(s): Abigail Miyamoto, MD  Anesthesia: General  Staff:  Circulator: Lenn Cal, RN Scrub Person: Maryelizabeth Kaufmann, RN  Estimated Blood Loss: Minimal               Specimens: sent to path  Findings: Each mass had the appearance consistent with accessory breast tissue in the axilla  Procedure: The patient was brought to the operating room identifies correct patient.  She was placed upon the operating table general anesthesia was induced.  Her bilateral axilla were then prepped and draped in usual sterile fashion.  I anesthetized the skin in the left axilla with Marcaine.  I then made elliptical incision including skin over the overlying mass.  I then dissected down into the axillary tissue with the cautery.  The mass appeared consistent with gynecomastia.  I excised it in its entirety and sent to pathology for evaluation.  I then achieved hemostasis with the cautery.  I closed the subcutaneous tissue with interrupted 3-0 Vicryl sutures and closed the skin with a running 4-0 Monocryl.  I then anesthetized skin of the right axilla with Marcaine as well.  I again performed elliptical incision with a scalpel.  I took this down to the axillary tissue with electrocautery.  Again this mass also appeared consistent with a sensory breast tissue.  I excised it in its entirety with the cautery as well.  Both sides appeared approximately 5 cm in size.  There were both sent to pathology for evaluation.  I likewise closed the incision on the right side with interrupted 3-0 Vicryl sutures and a running 4-0 Monocryl.  Dermabond was placed to both incisions.  The patient tolerated the procedure well.  All the counts were correct at the end of the procedure.  The patient was  then extubated in the operating room and taken in a stable condition to the recovery room.          Abigail Miyamoto   Date: 05/16/2019  Time: 9:16 AM

## 2019-05-16 NOTE — Interval H&P Note (Signed)
History and Physical Interval Note: no change in H and P  05/16/2019 7:51 AM  Erica Franco  has presented today for surgery, with the diagnosis of BILATERAL AXILLARY MASSES.  The various methods of treatment have been discussed with the patient and family. After consideration of risks, benefits and other options for treatment, the patient has consented to  Procedure(s): EXCISION BILATERAL AXILLARY MASSES (Bilateral) as a surgical intervention.  The patient's history has been reviewed, patient examined, no change in status, stable for surgery.  I have reviewed the patient's chart and labs.  Questions were answered to the patient's satisfaction.     Abigail Miyamoto

## 2019-05-16 NOTE — Anesthesia Procedure Notes (Signed)
Procedure Name: LMA Insertion Date/Time: 05/16/2019 8:26 AM Performed by: Shanon Payor, CRNA Pre-anesthesia Checklist: Patient identified, Emergency Drugs available, Suction available, Patient being monitored and Timeout performed Patient Re-evaluated:Patient Re-evaluated prior to induction Oxygen Delivery Method: Circle system utilized Preoxygenation: Pre-oxygenation with 100% oxygen Induction Type: IV induction LMA: LMA inserted LMA Size: 4.0 Number of attempts: 1 Placement Confirmation: positive ETCO2 and ETT inserted through vocal cords under direct vision Tube secured with: Tape Dental Injury: Teeth and Oropharynx as per pre-operative assessment

## 2019-05-16 NOTE — Anesthesia Postprocedure Evaluation (Signed)
Anesthesia Post Note  Patient: ADDISSON FRATE  Procedure(s) Performed: EXCISION BILATERAL AXILLARY MASSES (Bilateral Axilla)     Patient location during evaluation: Phase II Anesthesia Type: General Level of consciousness: awake Pain management: pain level controlled Vital Signs Assessment: post-procedure vital signs reviewed and stable Respiratory status: spontaneous breathing Cardiovascular status: stable Postop Assessment: no apparent nausea or vomiting Anesthetic complications: no    Last Vitals:  Vitals:   05/16/19 0932 05/16/19 0945  BP:  135/83  Pulse: 76 74  Resp: 20 15  Temp:    SpO2: 100% 95%    Last Pain:  Vitals:   05/16/19 0945  TempSrc:   PainSc: 0-No pain   Pain Goal: Patients Stated Pain Goal: 3 (05/16/19 0930)                 Caren Macadam

## 2019-05-17 ENCOUNTER — Encounter: Payer: Self-pay | Admitting: *Deleted

## 2019-05-17 LAB — SURGICAL PATHOLOGY

## 2019-12-27 ENCOUNTER — Telehealth: Payer: Self-pay | Admitting: Physician Assistant

## 2019-12-27 NOTE — Telephone Encounter (Signed)
Patient called back while I was unavailable. Attempted to call patient back with no answer. Left message. AS, CMA

## 2019-12-27 NOTE — Telephone Encounter (Signed)
Patient called requesting to speak with nurse, she has some clinical questions about COVID and the vaccine, please contact when available at [phone]

## 2019-12-27 NOTE — Telephone Encounter (Signed)
Left message for patient to call back. AS, CMA 

## 2019-12-28 NOTE — Telephone Encounter (Signed)
Patient would like to have medical exemption paperwork filled out for COVID vaccine. She is a [employer] and the deadline for this paperwork is next week.   Patient has not been seen in our office since 06/2018.    Patient states that she is just now getting over COVID and isnt sure that this is the best time to receive the vaccine.  Patient also states she has an autoimmune disorder and really doesn't want the vaccine at all.    Spoke with Kandis Cocking and Dr. Joneen Caraway who stated to set up apt for telehealth to discuss.   Call transferred to front desk for scheduling. AS, CMA

## 2019-12-31 ENCOUNTER — Encounter: Payer: Self-pay | Admitting: Physician Assistant

## 2019-12-31 ENCOUNTER — Ambulatory Visit (INDEPENDENT_AMBULATORY_CARE_PROVIDER_SITE_OTHER): Payer: No Typology Code available for payment source | Admitting: Physician Assistant

## 2019-12-31 ENCOUNTER — Other Ambulatory Visit: Payer: Self-pay

## 2019-12-31 VITALS — Ht 67.0 in | Wt 159.0 lb

## 2019-12-31 DIAGNOSIS — K900 Celiac disease: Secondary | ICD-10-CM

## 2019-12-31 DIAGNOSIS — U071 COVID-19: Secondary | ICD-10-CM

## 2019-12-31 NOTE — Progress Notes (Signed)
Telehealth office visit note for Erica Masker, PA-C- at Primary Care at [facility]   I connected with current patient today by telephone and verified that I am speaking with the correct person   . Location of the patient: Home . Location of the provider: Office - This visit type was conducted due to national recommendations for restrictions regarding the COVID-19 Pandemic (e.g. social distancing) in an effort to limit this patient's exposure and mitigate transmission in our community.    - No physical exam could be performed with this format, beyond that communicated to [place] by the patient/ family members as noted.   - Additionally my office staff/ schedulers were to discuss with the patient that there may be a monetary charge related to this service, depending on their medical insurance.  My understanding is that patient understood and consented to proceed.     _________________________________________________________________________________   History of Present Illness: Pt calls in to discuss COVID-19 vaccine exemption.  Reports she is still recovering from her recent COVID-19 infection.  Patient is still symptomatic with congestion and and bilateral ear fullness.  Patient states due to her autoimmune condition of celiac's disease she would be more comfortable getting the vaccine once it is FDA approved. She is concerned about possible side effects from the vaccine.     No flowsheet data found.  Depression screen [facility] 2/9 12/31/2019  Decreased Interest 0  Down, Depressed, Hopeless 0  PHQ - 2 Score 0  Altered sleeping 0  Tired, decreased energy 0  Change in appetite 0  Feeling bad or failure about yourself  0  Trouble concentrating 0  Moving slowly or fidgety/restless 0  Suicidal thoughts 0  PHQ-9 Score 0      Impression and Recommendations:     1. Celiac disease/ gluten sensitivity   2. COVID-19 virus infection     Celiac disease, COVID-19 infection: -Discussed with  patient guidelines/information for Covid-19 vaccination and has concerns about potential side effects, which is reasonable. -If medical exemption isn't approved, recommend getting Covid-19 vaccine 30-60 days post infection to ensure she has fully recovered and can tolerate possible side effects.    - As part of my medical decision making, I reviewed the following data within the electronic MEDICAL RECORD NUMBER History obtained from pt /family, CMA notes reviewed and incorporated if applicable, Labs reviewed, Radiograph/ tests reviewed if applicable and OV notes from prior OV's with me, as well as any other specialists she/he has seen since seeing me last, were all reviewed and used in my medical decision making process today.    - Additionally, when appropriate, discussion had with patient regarding our treatment plan, and their biases/concerns about that plan were used in my medical decision making today.    - The patient agreed with the plan and demonstrated an understanding of the instructions.   No barriers to understanding were identified.     - The patient was advised to call back or seek an in-person evaluation if the symptoms worsen or if the condition fails to improve as anticipated.   No follow-ups on file.    No orders of the defined types were placed in this encounter.   No orders of the defined types were placed in this encounter.   There are no discontinued medications.     Time spent on visit including pre-visit chart review and post-visit care was 10 minutes.      The 21st Century Cures Act was signed into law  in 2016 which includes the topic of electronic health records.  This provides immediate access to information in MyChart.  This includes consultation notes, operative notes, office notes, lab results and pathology reports.  If you have any questions about what you read please let us know at your next visit or call us at the office.  We are right here with  you.   __________________________________________________________________________________     Patient Care Team    Relationship Specialty Notifications Start End  Erica Franco, [place] PCP - General   09/09/19   Salvatore Marvel, MD Consulting Physician Orthopedic Surgery  06/26/18   Lorenza Burton I, DMD  Dentistry  06/26/18      -Vitals obtained; medications/ allergies reconciled;  personal medical, social, Sx etc.histories were updated by CMA, reviewed by me and are reflected in chart   Patient Active Problem List   Diagnosis Date Noted  . Celiac disease/ gluten sensitivity 06/26/2018  . Body mass index (BMI) of 25.0 to 29.9 06/26/2018  . Environmental and seasonal allergies 06/26/2018  . Family history of colon cancer 06/26/2018  . Family history of heart attack 06/26/2018  . Family history of diabetes mellitus (DM) 06/26/2018  . Strain of knee 04/14/2016     Current Meds  Medication Sig  . ibuprofen (ADVIL) 800 MG tablet Take 1 tablet (800 mg total) by mouth every 8 (eight) hours as needed.     Allergies:  Allergies  Allergen Reactions  . Gluten Meal Other (See Comments)     ROS:  See above HPI for pertinent positives and negatives   Objective:   Height 5\' 7"  (1.702 m), weight 159 lb (72.1 kg).  (if some vitals are omitted, this means that patient was UNABLE to obtain them even though they were asked to get them prior to OV today.  They were asked to call at their earliest convenience with these once obtained. ) General: A & O * 3; sounds in no acute distress; in usual state of health.  Respiratory: speaking in full sentences, no conversational dyspnea Psych: insight appears good, mood- appears full

## 2020-01-02 ENCOUNTER — Encounter: Payer: Self-pay | Admitting: Physician Assistant

## 2020-11-03 ENCOUNTER — Encounter: Payer: Self-pay | Admitting: Nurse Practitioner

## 2020-11-03 ENCOUNTER — Other Ambulatory Visit: Payer: Self-pay

## 2020-11-03 ENCOUNTER — Ambulatory Visit (INDEPENDENT_AMBULATORY_CARE_PROVIDER_SITE_OTHER): Payer: No Typology Code available for payment source | Admitting: Nurse Practitioner

## 2020-11-03 VITALS — BP 132/83 | HR 71 | Temp 98.4°F | Ht 67.0 in | Wt 159.0 lb

## 2020-11-03 DIAGNOSIS — R5383 Other fatigue: Secondary | ICD-10-CM

## 2020-11-03 DIAGNOSIS — Z0001 Encounter for general adult medical examination with abnormal findings: Secondary | ICD-10-CM | POA: Diagnosis not present

## 2020-11-03 DIAGNOSIS — Z0000 Encounter for general adult medical examination without abnormal findings: Secondary | ICD-10-CM

## 2020-11-03 DIAGNOSIS — Z1231 Encounter for screening mammogram for malignant neoplasm of breast: Secondary | ICD-10-CM

## 2020-11-03 NOTE — Progress Notes (Signed)
Established Patient Office Visit  Subjective:  Patient ID: Erica Franco, female    DOB: [birth year]  Age: 50 y.o. MRN: [id number]  CC:  Chief Complaint  Patient presents with   Annual Exam    HPI Erica Franco presents for annual wellness visit.  She sees a gynecologist routinely for well woman exams and Pap smears and mammograms.  She is due for fasting blood work.  She would like to wait until next year for screening colonoscopy.  She states she is fully vaccinated for COVID-19.  She receives a flu shot every year. She denies physical concerns or complaints at this time.  She denies chest pain, chest pressure, or shortness of breath. She denies headaches or visual disturbances. She denies abdominal pain, nausea, vomiting, or changes in bowel or bladder habits.    Past Medical History:  Diagnosis Date   Celiac disease    Menorrhagia     Past Surgical History:  Procedure Laterality Date   DILITATION & CURRETTAGE/HYSTROSCOPY WITH NOVASURE ABLATION N/A 11/14/2018   Procedure: DILATATION & CURETTAGE/HYSTEROSCOPY WITH NOVASURE ABLATION;  Surgeon: Olga Millers, MD;  Location: [facility];  Service: Gynecology;  Laterality: N/A;   MASS EXCISION Bilateral 05/16/2019   Procedure: EXCISION BILATERAL AXILLARY MASSES;  Surgeon: Coralie Keens, MD;  Location: [place];  Service: General;  Laterality: Bilateral;   WISDOM TOOTH EXTRACTION  teen    Family History  Problem Relation Age of Onset   Alcohol abuse Brother    Cancer Paternal Aunt        colon   Diabetes Maternal Grandmother    Stroke Maternal Grandfather    Cancer Paternal Grandmother        Cervical   Heart attack Paternal Grandfather    Alcohol abuse Brother    Breast cancer Neg Hx     Social History   Socioeconomic History   Marital status: Married    Spouse name: Not on file   Number of children: Not on file   Years of education: Not on file   Highest education level:  Not on file  Occupational History   Not on file  Tobacco Use   Smoking status: Former    Packs/day: 0.10    Years: 4.00    Pack years: 0.40    Types: Cigarettes    Quit date: 11/12/1988    Years since quitting: 32.0   Smokeless tobacco: Never  Vaping Use   Vaping Use: Never used  Substance and Sexual Activity   Alcohol use: Never   Drug use: Never   Sexual activity: Yes    Birth control/protection: None  Other Topics Concern   Not on file  Social History Narrative   Not on file   Social Determinants of Health   Financial Resource Strain: Not on file  Food Insecurity: Not on file  Transportation Needs: Not on file  Physical Activity: Not on file  Stress: Not on file  Social Connections: Not on file  Intimate Partner Violence: Not on file    Outpatient Medications Prior to Visit  Medication Sig Dispense Refill   ibuprofen (ADVIL) 800 MG tablet Take 1 tablet (800 mg total) by mouth every 8 (eight) hours as needed. (Patient not taking: Reported on 11/03/2020) 30 tablet 0   No facility-administered medications prior to visit.    Allergies  Allergen Reactions   Gluten Meal Other (See Comments)    ROS Review of Systems  Constitutional:  Negative for  activity change, chills, fatigue and fever.  HENT:  Negative for congestion, postnasal drip, rhinorrhea, sinus pressure and sinus pain.   Eyes: Negative.   Respiratory:  Negative for cough, chest tightness, shortness of breath and wheezing.   Cardiovascular:  Negative for chest pain and palpitations.  Gastrointestinal:  Negative for constipation, diarrhea, nausea and vomiting.  Endocrine: Negative for cold intolerance, heat intolerance, polydipsia and polyuria.  Musculoskeletal:  Negative for arthralgias, back pain and myalgias.  Skin:  Negative for rash.  Allergic/Immunologic: Negative.   Neurological:  Negative for dizziness, weakness and headaches.  Hematological: Negative.   Psychiatric/Behavioral:  Negative for  dysphoric mood. The patient is not nervous/anxious.      Objective:    Physical Exam Vitals and nursing note reviewed.  Constitutional:      Appearance: Normal appearance. She is well-developed.  HENT:     Head: Normocephalic and atraumatic.     Right Ear: Ear canal and external ear normal.     Left Ear: Ear canal and external ear normal.     Nose: Nose normal.     Mouth/Throat:     Mouth: Mucous membranes are moist.     Pharynx: Oropharynx is clear.  Eyes:     Extraocular Movements: Extraocular movements intact.     Conjunctiva/sclera: Conjunctivae normal.     Pupils: Pupils are equal, round, and reactive to light.  Neck:     Vascular: No carotid bruit.  Cardiovascular:     Rate and Rhythm: Normal rate and regular rhythm.     Pulses: Normal pulses.     Heart sounds: Normal heart sounds.  Pulmonary:     Effort: Pulmonary effort is normal.     Breath sounds: Normal breath sounds.  Abdominal:     General: Bowel sounds are normal.     Palpations: Abdomen is soft.     Tenderness: There is no abdominal tenderness.  Musculoskeletal:        General: Normal range of motion.     Cervical back: Normal range of motion and neck supple.  Lymphadenopathy:     Cervical: No cervical adenopathy.  Skin:    General: Skin is warm and dry.     Capillary Refill: Capillary refill takes less than 2 seconds.  Neurological:     General: No focal deficit present.     Mental Status: She is alert and oriented to person, place, and time.  Psychiatric:        Mood and Affect: Mood normal.        Behavior: Behavior normal.        Thought Content: Thought content normal.        Judgment: Judgment normal.    Today's Vitals   11/03/20 1005  BP: 132/83  Pulse: 71  Temp: 98.4 F (36.9 C)  SpO2: 98%  Weight: 159 lb (72.1 kg)  Height: 5' 7"  (1.702 m)   Body mass index is 24.9 kg/m.   Wt Readings from Last 3 Encounters:  11/03/20 159 lb (72.1 kg)  12/31/19 159 lb (72.1 kg)  05/16/19 174  lb 2.6 oz (79 kg)     Health Maintenance Due  Topic Date Due   HIV Screening  Never done   Hepatitis C Screening  Never done   PAP SMEAR-Modifier  Never done   COLONOSCOPY (Pts 45-41yr Insurance coverage will need to be confirmed)  Never done   COVID-19 Vaccine (3 - Booster for Pfizer series) 07/08/2020    There are no preventive  care reminders to display for this patient.  Lab Results  Component Value Date   TSH 1.560 11/03/2020   Lab Results  Component Value Date   WBC 5.0 11/03/2020   HGB 13.7 11/03/2020   HCT 41.2 11/03/2020   MCV 90 11/03/2020   PLT 217 11/03/2020   Lab Results  Component Value Date   NA 139 11/03/2020   K 4.5 11/03/2020   CO2 23 11/03/2020   GLUCOSE 88 11/03/2020   BUN 10 11/03/2020   CREATININE 0.95 11/03/2020   BILITOT 0.9 11/03/2020   ALKPHOS 80 11/03/2020   AST 24 11/03/2020   ALT 28 11/03/2020   PROT 6.4 11/03/2020   ALBUMIN 4.5 11/03/2020   CALCIUM 9.6 11/03/2020   EGFR 73 11/03/2020   Lab Results  Component Value Date   CHOL 211 (H) 11/03/2020   Lab Results  Component Value Date   HDL 52 11/03/2020   Lab Results  Component Value Date   LDLCALC 144 (H) 11/03/2020   Lab Results  Component Value Date   TRIG 86 11/03/2020   Lab Results  Component Value Date   CHOLHDL 4.1 11/03/2020   Lab Results  Component Value Date   HGBA1C 5.5 11/14/2018      Assessment & Plan:  1. Encounter for general adult medical examination with abnormal findings Annual wellness visit today.  2. Other fatigue A thyroid panel was drawn during today's visit.  Will discuss results with patient when results are available. - T4, free - TSH  3. Encounter for screening mammogram for malignant neoplasm of breast A screening mammogram was ordered today. - MM DIGITAL SCREENING BILATERAL; Future  4. Healthcare maintenance Routine, fasting labs were drawn during today's visit.  Will notify patient when results are available. - CBC with  Differential/Platelet - Comprehensive metabolic panel - T4, free - TSH - Lipid panel   Problem List Items Addressed This Visit       Other   Encounter for general adult medical examination with abnormal findings - Primary   Other fatigue   Relevant Orders   T4, free (Completed)   TSH (Completed)   Healthcare maintenance   Relevant Orders   CBC with Differential/Platelet (Completed)   Comprehensive metabolic panel (Completed)   T4, free (Completed)   TSH (Completed)   Lipid panel (Completed)       Follow-up: Return in about 1 year (around 11/03/2021) for health maintenance exam, FBW at time of visit .    Ronnell Freshwater, NP

## 2020-11-04 LAB — LIPID PANEL
Chol/HDL Ratio: 4.1 ratio (ref 0.0–4.4)
Cholesterol, Total: 211 mg/dL — ABNORMAL HIGH (ref 100–199)
HDL: 52 mg/dL (ref >39)
LDL Chol Calc (NIH): 144 mg/dL — ABNORMAL HIGH (ref 0–99)
Triglycerides: 86 mg/dL (ref 0–149)
VLDL Cholesterol Cal: 15 mg/dL (ref 5–40)

## 2020-11-04 LAB — CBC WITH DIFFERENTIAL/PLATELET
Basophils Absolute: 0 10*3/uL (ref 0.0–0.2)
Basos: 1 %
EOS (ABSOLUTE): 0.1 10*3/uL (ref 0.0–0.4)
Eos: 2 %
Hematocrit: 41.2 % (ref 34.0–46.6)
Hemoglobin: 13.7 g/dL (ref 11.1–15.9)
Immature Grans (Abs): 0 10*3/uL (ref 0.0–0.1)
Immature Granulocytes: 0 %
Lymphocytes Absolute: 1.4 10*3/uL (ref 0.7–3.1)
Lymphs: 29 %
MCH: 29.8 pg (ref 26.6–33.0)
MCHC: 33.3 g/dL (ref 31.5–35.7)
MCV: 90 fL (ref 79–97)
Monocytes Absolute: 0.4 10*3/uL (ref 0.1–0.9)
Monocytes: 8 %
Neutrophils Absolute: 3 10*3/uL (ref 1.4–7.0)
Neutrophils: 60 %
Platelets: 217 10*3/uL (ref 150–450)
RBC: 4.6 x10E6/uL (ref 3.77–5.28)
RDW: 13.1 % (ref 11.7–15.4)
WBC: 5 10*3/uL (ref 3.4–10.8)

## 2020-11-04 LAB — COMPREHENSIVE METABOLIC PANEL
ALT: 28 IU/L (ref 0–32)
AST: 24 IU/L (ref 0–40)
Albumin/Globulin Ratio: 2.4 — ABNORMAL HIGH (ref 1.2–2.2)
Albumin: 4.5 g/dL (ref 3.8–4.8)
Alkaline Phosphatase: 80 IU/L (ref 44–121)
BUN/Creatinine Ratio: 11 (ref 9–23)
BUN: 10 mg/dL (ref 6–24)
Bilirubin Total: 0.9 mg/dL (ref 0.0–1.2)
CO2: 23 mmol/L (ref 20–29)
Calcium: 9.6 mg/dL (ref 8.7–10.2)
Chloride: 102 mmol/L (ref 96–106)
Creatinine, Ser: 0.95 mg/dL (ref 0.57–1.00)
Globulin, Total: 1.9 g/dL (ref 1.5–4.5)
Glucose: 88 mg/dL (ref 65–99)
Potassium: 4.5 mmol/L (ref 3.5–5.2)
Sodium: 139 mmol/L (ref 134–144)
Total Protein: 6.4 g/dL (ref 6.0–8.5)
eGFR: 73 mL/min/{1.73_m2} (ref >59)

## 2020-11-04 LAB — T4, FREE: Free T4: 1.12 ng/dL (ref 0.82–1.77)

## 2020-11-04 LAB — TSH: TSH: 1.56 u[IU]/mL (ref 0.450–4.500)

## 2020-11-05 ENCOUNTER — Encounter: Payer: Self-pay | Admitting: Nurse Practitioner

## 2020-11-05 NOTE — Progress Notes (Signed)
LDL and total cholesterol a little elevated. MyChart message sent to patient.

## 2020-11-10 DIAGNOSIS — R5383 Other fatigue: Secondary | ICD-10-CM | POA: Insufficient documentation

## 2020-11-10 DIAGNOSIS — Z0000 Encounter for general adult medical examination without abnormal findings: Secondary | ICD-10-CM | POA: Insufficient documentation

## 2020-11-10 DIAGNOSIS — Z0001 Encounter for general adult medical examination with abnormal findings: Secondary | ICD-10-CM | POA: Insufficient documentation

## 2020-11-20 ENCOUNTER — Other Ambulatory Visit: Payer: Self-pay | Admitting: Nurse Practitioner

## 2020-11-20 DIAGNOSIS — Z1231 Encounter for screening mammogram for malignant neoplasm of breast: Secondary | ICD-10-CM

## 2020-12-31 ENCOUNTER — Other Ambulatory Visit: Payer: Self-pay

## 2020-12-31 MED ORDER — CARESTART COVID-19 HOME TEST VI KIT
PACK | 0 refills | Status: DC
  Filled 2020-12-31: qty 2, 4d supply, fill #0

## 2022-01-04 NOTE — Progress Notes (Unsigned)
Complete physical exam   Patient: Erica Franco   DOB: 03-18-1971   51 y.o. Female  MRN: [id number] Visit Date: 01/05/2022    No chief complaint on file.  Subjective    Erica Franco is a 51 y.o. female who presents today for a complete physical exam.  She reports consuming a {diet types:17450} diet. {Exercise:19826} She generally feels {well/fairly well/poorly:18703}. She {does/does not:200015} have additional problems to discuss today.   HPI  Annual physical -due for routine, fasting blood work.  -due for screening mammogram. Unsure if she had mammogram last year  -discuss screening colonoscopy.  -? Need for pap smear.   Past Medical History:  Diagnosis Date   Celiac disease    Menorrhagia    Past Surgical History:  Procedure Laterality Date   DILITATION & CURRETTAGE/HYSTROSCOPY WITH NOVASURE ABLATION N/A 11/14/2018   Procedure: DILATATION & CURETTAGE/HYSTEROSCOPY WITH NOVASURE ABLATION;  Surgeon: Olga Millers, MD;  Location: [facility];  Service: Gynecology;  Laterality: N/A;   MASS EXCISION Bilateral 05/16/2019   Procedure: EXCISION BILATERAL AXILLARY MASSES;  Surgeon: Coralie Keens, MD;  Location: [place];  Service: General;  Laterality: Bilateral;   WISDOM TOOTH EXTRACTION  teen   Social History   Socioeconomic History   Marital status: Married    Spouse name: Not on file   Number of children: Not on file   Years of education: Not on file   Highest education level: Not on file  Occupational History   Not on file  Tobacco Use   Smoking status: Former    Packs/day: 0.10    Years: 4.00    Total pack years: 0.40    Types: Cigarettes    Quit date: 11/12/1988    Years since quitting: 33.1   Smokeless tobacco: Never  Vaping Use   Vaping Use: Never used  Substance and Sexual Activity   Alcohol use: Never   Drug use: Never   Sexual activity: Yes    Birth control/protection: None  Other Topics Concern   Not on file   Social History Narrative   Not on file   Social Determinants of Health   Financial Resource Strain: Not on file  Food Insecurity: Not on file  Transportation Needs: Not on file  Physical Activity: Not on file  Stress: Not on file  Social Connections: Not on file  Intimate Partner Violence: Not on file   Family Status  Relation Name Status   Brother  (Not Specified)   Field seismologist  (Not Specified)   MGM  (Not Specified)   MGF  (Not Specified)   PGM  (Not Specified)   PGF  (Not Specified)   Brother  (Not Specified)   Neg Hx  (Not Specified)   Family History  Problem Relation Age of Onset   Alcohol abuse Brother    Cancer Paternal Aunt        colon   Diabetes Maternal Grandmother    Stroke Maternal Grandfather    Cancer Paternal Grandmother        Cervical   Heart attack Paternal Grandfather    Alcohol abuse Brother    Breast cancer Neg Hx    Allergies  Allergen Reactions   Gluten Meal Other (See Comments)    Patient Care Team: Ronnell Freshwater, NP as PCP - General (Family Medicine) Elsie Saas, MD as Consulting Physician (Orthopedic Surgery) Doristine Church I, DMD (Dentistry)   Medications: Outpatient Medications Prior to Visit  Medication Sig  COVID-19 At Home Antigen Test (CARESTART COVID-19 HOME TEST) KIT USE AS DIRECTED WITHIN PACKAGE INSTRUCTIONS   ibuprofen (ADVIL) 800 MG tablet Take 1 tablet (800 mg total) by mouth every 8 (eight) hours as needed. (Patient not taking: Reported on 11/03/2020)   No facility-administered medications prior to visit.    Review of Systems  {Labs (Optional):23779}   Objective    There were no vitals taken for this visit. BP Readings from Last 3 Encounters:  11/03/20 132/83  05/16/19 136/90  11/14/18 (!) 157/95    Wt Readings from Last 3 Encounters:  11/03/20 159 lb (72.1 kg)  12/31/19 159 lb (72.1 kg)  05/16/19 174 lb 2.6 oz (79 kg)     Physical Exam  ***  Last depression screening scores    11/03/2020    10:09 AM 12/31/2019    9:33 AM  PHQ 2/9 Scores  PHQ - 2 Score 0 0  PHQ- 9 Score 0 0   Last fall risk screening    11/03/2020   10:09 AM  Fall Risk   Falls in the past year? 0  Number falls in past yr: 0  Injury with Fall? 0  Follow up Falls evaluation completed   Last Audit-C alcohol use screening     No data to display         A score of 3 or more in women, and 4 or more in men indicates increased risk for alcohol abuse, EXCEPT if all of the points are from question 1   No results found for any visits on 01/05/22.  Assessment & Plan    Routine Health Maintenance and Physical Exam  Exercise Activities and Dietary recommendations  Goals   None     Immunization History  Administered Date(s) Administered   Influenza-Unspecified 03/10/2018   PFIZER(Purple Top)SARS-COV-2 Vaccination 01/18/2020, 02/08/2020   Tdap 05/10/2014    Health Maintenance  Topic Date Due   HIV Screening  Never done   Hepatitis C Screening  Never done   PAP SMEAR-Modifier  Never done   COLONOSCOPY (Pts 45-54yr Insurance coverage will need to be confirmed)  Never done   COVID-19 Vaccine (3 - Pfizer series) 04/04/2020   MAMMOGRAM  05/10/2021   Zoster Vaccines- Shingrix (1 of 2) Never done   INFLUENZA VACCINE  12/08/2021   TETANUS/TDAP  05/10/2024   HPV VACCINES  Aged Out    Discussed health benefits of physical activity, and encouraged her to engage in regular exercise appropriate for her age and condition.  Problem List Items Addressed This Visit   None    No follow-ups on file.        HRonnell Freshwater NP  [phone](phone) [phone](fax)  [place]

## 2022-01-05 ENCOUNTER — Encounter: Payer: Self-pay | Admitting: Nurse Practitioner

## 2022-01-05 ENCOUNTER — Ambulatory Visit (INDEPENDENT_AMBULATORY_CARE_PROVIDER_SITE_OTHER): Payer: No Typology Code available for payment source | Admitting: Nurse Practitioner

## 2022-01-05 VITALS — BP 135/85 | HR 72 | Ht 67.0 in | Wt 176.0 lb

## 2022-01-05 DIAGNOSIS — D649 Anemia, unspecified: Secondary | ICD-10-CM | POA: Insufficient documentation

## 2022-01-05 DIAGNOSIS — D508 Other iron deficiency anemias: Secondary | ICD-10-CM

## 2022-01-05 DIAGNOSIS — R5383 Other fatigue: Secondary | ICD-10-CM

## 2022-01-05 DIAGNOSIS — Z1231 Encounter for screening mammogram for malignant neoplasm of breast: Secondary | ICD-10-CM

## 2022-01-05 DIAGNOSIS — E559 Vitamin D deficiency, unspecified: Secondary | ICD-10-CM | POA: Diagnosis not present

## 2022-01-05 DIAGNOSIS — Z1211 Encounter for screening for malignant neoplasm of colon: Secondary | ICD-10-CM

## 2022-01-05 DIAGNOSIS — Z0000 Encounter for general adult medical examination without abnormal findings: Secondary | ICD-10-CM

## 2022-01-05 DIAGNOSIS — Z0001 Encounter for general adult medical examination with abnormal findings: Secondary | ICD-10-CM

## 2022-01-05 DIAGNOSIS — R790 Abnormal level of blood mineral: Secondary | ICD-10-CM

## 2022-01-06 LAB — COMPREHENSIVE METABOLIC PANEL
ALT: 20 IU/L (ref 0–32)
AST: 23 IU/L (ref 0–40)
Albumin/Globulin Ratio: 1.9 (ref 1.2–2.2)
Albumin: 4.6 g/dL (ref 3.9–4.9)
Alkaline Phosphatase: 71 IU/L (ref 44–121)
BUN/Creatinine Ratio: 16 (ref 9–23)
BUN: 15 mg/dL (ref 6–24)
Bilirubin Total: 1.6 mg/dL — ABNORMAL HIGH (ref 0.0–1.2)
CO2: 25 mmol/L (ref 20–29)
Calcium: 9.5 mg/dL (ref 8.7–10.2)
Chloride: 102 mmol/L (ref 96–106)
Creatinine, Ser: 0.91 mg/dL (ref 0.57–1.00)
Globulin, Total: 2.4 g/dL (ref 1.5–4.5)
Glucose: 85 mg/dL (ref 70–99)
Potassium: 4.5 mmol/L (ref 3.5–5.2)
Sodium: 141 mmol/L (ref 134–144)
Total Protein: 7 g/dL (ref 6.0–8.5)
eGFR: 77 mL/min/{1.73_m2} (ref >59)

## 2022-01-06 LAB — LIPID PANEL
Chol/HDL Ratio: 4.1 ratio (ref 0.0–4.4)
Cholesterol, Total: 215 mg/dL — ABNORMAL HIGH (ref 100–199)
HDL: 53 mg/dL (ref >39)
LDL Chol Calc (NIH): 151 mg/dL — ABNORMAL HIGH (ref 0–99)
Triglycerides: 65 mg/dL (ref 0–149)
VLDL Cholesterol Cal: 11 mg/dL (ref 5–40)

## 2022-01-06 LAB — TSH+FREE T4
Free T4: 1.16 ng/dL (ref 0.82–1.77)
TSH: 1.84 u[IU]/mL (ref 0.450–4.500)

## 2022-01-06 LAB — CBC
Hematocrit: 40.9 % (ref 34.0–46.6)
Hemoglobin: 14 g/dL (ref 11.1–15.9)
MCH: 29.9 pg (ref 26.6–33.0)
MCHC: 34.2 g/dL (ref 31.5–35.7)
MCV: 87 fL (ref 79–97)
Platelets: 227 10*3/uL (ref 150–450)
RBC: 4.69 x10E6/uL (ref 3.77–5.28)
RDW: 13 % (ref 11.7–15.4)
WBC: 3.8 10*3/uL (ref 3.4–10.8)

## 2022-01-06 LAB — HEMOGLOBIN A1C
Est. average glucose Bld gHb Est-mCnc: 111 mg/dL
Hgb A1c MFr Bld: 5.5 % (ref 4.8–5.6)

## 2022-01-06 LAB — B12 AND FOLATE PANEL
Folate: 4.8 ng/mL (ref >3.0)
Vitamin B-12: 276 pg/mL (ref 232–1245)

## 2022-01-06 LAB — FERRITIN: Ferritin: 124 ng/mL (ref 15–150)

## 2022-01-06 LAB — VITAMIN D 25 HYDROXY (VIT D DEFICIENCY, FRACTURES): Vit D, 25-Hydroxy: 28.3 ng/mL — ABNORMAL LOW (ref 30.0–100.0)

## 2022-01-07 ENCOUNTER — Encounter: Payer: Self-pay | Admitting: Nurse Practitioner

## 2022-01-07 NOTE — Progress Notes (Signed)
MyChart message sent to patient -   The total bilirubin is just slightly elevated. By itself, I am not worried about this, especially since your renal and liver functions are normal.  I don't see anything wrong with A/G ratio.  I did notice that your B12 level and ferritin levels are a little low, but still noral. I recommend that you take an OTC B complex vitamin daily. I also recommend a OTC vitamin D supplement about 5000 iu daily.  With the cholesterol, I recommend avoiding fried and fatty foods and increasing lean proteins and fresh vegetables. Increasing exercise will also help.

## 2022-01-19 ENCOUNTER — Other Ambulatory Visit: Payer: Self-pay

## 2022-01-19 ENCOUNTER — Ambulatory Visit (INDEPENDENT_AMBULATORY_CARE_PROVIDER_SITE_OTHER): Payer: No Typology Code available for payment source | Admitting: Nurse Practitioner

## 2022-01-19 ENCOUNTER — Encounter: Payer: Self-pay | Admitting: Nurse Practitioner

## 2022-01-19 VITALS — BP 109/74 | HR 83 | Ht 67.0 in | Wt 176.4 lb

## 2022-01-19 DIAGNOSIS — E538 Deficiency of other specified B group vitamins: Secondary | ICD-10-CM

## 2022-01-19 DIAGNOSIS — E559 Vitamin D deficiency, unspecified: Secondary | ICD-10-CM

## 2022-01-19 DIAGNOSIS — Z6827 Body mass index (BMI) 27.0-27.9, adult: Secondary | ICD-10-CM | POA: Diagnosis not present

## 2022-01-19 MED ORDER — PHENTERMINE HCL 37.5 MG PO TABS
37.5000 mg | ORAL_TABLET | Freq: Every day | ORAL | 1 refills | Status: DC
  Filled 2022-01-19: qty 30, 30d supply, fill #0

## 2022-01-19 MED ORDER — ERGOCALCIFEROL 1.25 MG (50000 UT) PO CAPS
50000.0000 [IU] | ORAL_CAPSULE | ORAL | 3 refills | Status: DC
  Filled 2022-01-19: qty 4, 28d supply, fill #0
  Filled 2022-02-10: qty 4, 28d supply, fill #1

## 2022-01-19 MED ORDER — CYANOCOBALAMIN 1000 MCG/ML IJ SOLN
1000.0000 ug | Freq: Once | INTRAMUSCULAR | Status: AC
  Administered 2022-01-19: 1000 ug via INTRAMUSCULAR

## 2022-01-19 NOTE — Progress Notes (Signed)
Established patient visit   Patient: Erica Franco   DOB: [birth year]   51 y.o. Female  MRN: [id number] Visit Date: 01/19/2022   Chief Complaint  Patient presents with   review labs   Subjective    HPI  Routine follow up visit.  -lab review  ---The total bilirubin is just slightly elevated and liver functions are normal.  --B12 level and ferritin levels are a little low, but still nor,al.  --vitamin d level slightly low   --LDL and total cholesterol mildly elevated   Patient interested in something to help her with weight loss.  -recently joined [employer]. Is already limiting calorie intake and exercising more frequently.     Medications: Outpatient Medications Prior to Visit  Medication Sig   meloxicam (MOBIC) 15 MG tablet Take 1 tablet every day by oral route.   [DISCONTINUED] ibuprofen (ADVIL) 800 MG tablet Take 1 tablet (800 mg total) by mouth every 8 (eight) hours as needed. (Patient not taking: Reported on 11/03/2020)   No facility-administered medications prior to visit.    Review of Systems  Constitutional:  Positive for fatigue. Negative for activity change, appetite change, chills and fever.       Difficulty with weight loss   HENT:  Negative for congestion, postnasal drip, rhinorrhea, sinus pressure, sinus pain, sneezing and sore throat.   Eyes: Negative.   Respiratory:  Negative for cough, chest tightness, shortness of breath and wheezing.   Cardiovascular:  Negative for chest pain and palpitations.  Gastrointestinal:  Negative for abdominal pain, constipation, diarrhea, nausea and vomiting.  Endocrine: Negative for cold intolerance, heat intolerance, polydipsia and polyuria.  Genitourinary:  Negative for dyspareunia, dysuria, flank pain, frequency and urgency.  Musculoskeletal:  Negative for arthralgias, back pain and myalgias.  Skin:  Negative for rash.  Allergic/Immunologic: Negative for environmental allergies.  Neurological:  Negative for  dizziness, weakness and headaches.  Hematological:  Negative for adenopathy.  Psychiatric/Behavioral:  The patient is not nervous/anxious.     Last CBC Lab Results  Component Value Date   WBC 3.8 01/05/2022   HGB 14.0 01/05/2022   HCT 40.9 01/05/2022   MCV 87 01/05/2022   MCH 29.9 01/05/2022   RDW 13.0 01/05/2022   PLT 227 46/27/0350   Last metabolic panel Lab Results  Component Value Date   GLUCOSE 85 01/05/2022   NA 141 01/05/2022   K 4.5 01/05/2022   CL 102 01/05/2022   CO2 25 01/05/2022   BUN 15 01/05/2022   CREATININE 0.91 01/05/2022   EGFR 77 01/05/2022   CALCIUM 9.5 01/05/2022   PROT 7.0 01/05/2022   ALBUMIN 4.6 01/05/2022   LABGLOB 2.4 01/05/2022   AGRATIO 1.9 01/05/2022   BILITOT 1.6 (H) 01/05/2022   ALKPHOS 71 01/05/2022   AST 23 01/05/2022   ALT 20 01/05/2022   Last lipids Lab Results  Component Value Date   CHOL 215 (H) 01/05/2022   HDL 53 01/05/2022   LDLCALC 151 (H) 01/05/2022   TRIG 65 01/05/2022   CHOLHDL 4.1 01/05/2022   Last hemoglobin A1c Lab Results  Component Value Date   HGBA1C 5.5 01/05/2022   Last thyroid functions Lab Results  Component Value Date   TSH 1.840 01/05/2022   Last vitamin D Lab Results  Component Value Date   VD25OH 28.3 (L) 01/05/2022   Last vitamin B12 and Folate Lab Results  Component Value Date   VITAMINB12 276 01/05/2022   FOLATE 4.8 01/05/2022       Objective  Today's Vitals   01/19/22 1310  BP: 109/74  Pulse: 83  SpO2: 98%  Weight: 176 lb 6.4 oz (80 kg)   Body mass index is 27.63 kg/m.   BP Readings from Last 3 Encounters:  01/19/22 109/74  01/05/22 135/85  11/03/20 132/83    Wt Readings from Last 3 Encounters:  01/19/22 176 lb 6.4 oz (80 kg)  01/05/22 176 lb (79.8 kg)  11/03/20 159 lb (72.1 kg)    Physical Exam Vitals and nursing note reviewed.  Constitutional:      Appearance: Normal appearance. She is well-developed.  HENT:     Head: Normocephalic and atraumatic.  Eyes:      Pupils: Pupils are equal, round, and reactive to light.  Cardiovascular:     Rate and Rhythm: Normal rate and regular rhythm.     Pulses: Normal pulses.     Heart sounds: Normal heart sounds.  Pulmonary:     Effort: Pulmonary effort is normal.     Breath sounds: Normal breath sounds.  Abdominal:     Palpations: Abdomen is soft.  Musculoskeletal:        General: Normal range of motion.     Cervical back: Normal range of motion and neck supple.  Lymphadenopathy:     Cervical: No cervical adenopathy.  Skin:    General: Skin is warm and dry.     Capillary Refill: Capillary refill takes less than 2 seconds.  Neurological:     General: No focal deficit present.     Mental Status: She is alert and oriented to person, place, and time.  Psychiatric:        Mood and Affect: Mood normal.        Behavior: Behavior normal.        Thought Content: Thought content normal.        Judgment: Judgment normal.       Assessment & Plan    1. Vitamin B12 deficiency B12 injection administered during today's visit  - cyanocobalamin (VITAMIN B12) injection 1,000 mcg  2. Vitamin D deficiency Treat with drisdol 50000 iu weekly for 4 months. Follow with OTC vitamin d 5000 iu daily.  - ergocalciferol (DRISDOL) 1.25 MG (50000 UT) capsule; Take 1 capsule (50,000 Units total) by mouth once a week.  Dispense: 4 capsule; Refill: 3  3. BMI 27.0-27.9,adult Trial phentermine daily. Discussed lowering calorie intake to 1500 calories per day and incorporating exercise into daily routine to help lose weight.  - phentermine (ADIPEX-P) 37.5 MG tablet; Take 1 tablet (37.5 mg total) by mouth daily before breakfast.  Dispense: 30 tablet; Refill: 1    Problem List Items Addressed This Visit       Other   Vitamin D deficiency   Relevant Medications   ergocalciferol (DRISDOL) 1.25 MG (50000 UT) capsule   Vitamin B12 deficiency - Primary   BMI 27.0-27.9,adult   Relevant Medications   phentermine (ADIPEX-P)  37.5 MG tablet     Return in about 4 weeks (around 02/16/2022) for routine - weight management and b12 injection .         Ronnell Freshwater, NP  [facility] at [facility] [phone] (phone) [phone] (fax)  [place]

## 2022-02-10 ENCOUNTER — Other Ambulatory Visit: Payer: Self-pay

## 2022-02-11 ENCOUNTER — Ambulatory Visit
Admission: RE | Admit: 2022-02-11 | Discharge: 2022-02-11 | Disposition: A | Discharge status: Home / Self Care | Payer: No Typology Code available for payment source | Source: Ambulatory Visit | Attending: Nurse Practitioner | Admitting: Nurse Practitioner

## 2022-02-11 DIAGNOSIS — Z1231 Encounter for screening mammogram for malignant neoplasm of breast: Secondary | ICD-10-CM | POA: Diagnosis present

## 2022-02-16 ENCOUNTER — Other Ambulatory Visit: Payer: Self-pay

## 2022-02-16 ENCOUNTER — Ambulatory Visit (INDEPENDENT_AMBULATORY_CARE_PROVIDER_SITE_OTHER): Payer: No Typology Code available for payment source | Admitting: Nurse Practitioner

## 2022-02-16 ENCOUNTER — Encounter: Payer: Self-pay | Admitting: Nurse Practitioner

## 2022-02-16 VITALS — BP 115/67 | HR 81 | Ht 67.0 in | Wt 168.1 lb

## 2022-02-16 DIAGNOSIS — Z6827 Body mass index (BMI) 27.0-27.9, adult: Secondary | ICD-10-CM

## 2022-02-16 DIAGNOSIS — E538 Deficiency of other specified B group vitamins: Secondary | ICD-10-CM

## 2022-02-16 MED ORDER — CYANOCOBALAMIN 1000 MCG/ML IJ SOLN
1000.0000 ug | Freq: Once | INTRAMUSCULAR | Status: AC
  Administered 2022-02-16: 1000 ug via INTRAMUSCULAR

## 2022-02-16 MED ORDER — PHENTERMINE HCL 37.5 MG PO TABS
37.5000 mg | ORAL_TABLET | Freq: Every day | ORAL | 1 refills | Status: DC
  Filled 2022-02-16: qty 30, 30d supply, fill #0

## 2022-02-16 NOTE — Progress Notes (Signed)
Established patient visit   Patient: Erica Franco   DOB: 1970-05-27   51 y.o. Female  MRN: [id number] Visit Date: 02/16/2022   Chief Complaint  Patient presents with   Follow-up   Subjective    HPI  Follow up weight management  -started phentermine at last visit 01/19/2022 -initial weight 01/19/2022 - 176 pounds  -today's weight - 168 pounds  -weight loss since last visit - 10 pounds  Negative side effects from taking phentermine - did notice some mild constipation and dry mouth  In addition to phentermine, she has also cut her sugar intake way back. She is eating smaller portions of food. Is not eating after 7 pm.  She has not started exercising yet. This is goal prior to next visit  She states that her overall goal is to lose an additional 15 pounds  She would also like to get B12 injection.   Medications: Outpatient Medications Prior to Visit  Medication Sig   ergocalciferol (DRISDOL) 1.25 MG (50000 UT) capsule Take 1 capsule (50,000 Units total) by mouth once a week.   [DISCONTINUED] meloxicam (MOBIC) 15 MG tablet Take 1 tablet every day by oral route.   [DISCONTINUED] phentermine (ADIPEX-P) 37.5 MG tablet Take 1 tablet (37.5 mg total) by mouth daily before breakfast.   No facility-administered medications prior to visit.    Review of Systems  Constitutional:  Positive for fatigue. Negative for activity change, appetite change, chills and fever.       10 pound weight loss since most recent visit   HENT:  Negative for congestion, postnasal drip, rhinorrhea, sinus pressure, sinus pain, sneezing and sore throat.   Eyes: Negative.   Respiratory:  Negative for cough, chest tightness, shortness of breath and wheezing.   Cardiovascular:  Negative for chest pain and palpitations.  Gastrointestinal:  Negative for abdominal pain, constipation, diarrhea, nausea and vomiting.  Endocrine: Negative for cold intolerance, heat intolerance, polydipsia and polyuria.  Genitourinary:   Negative for dyspareunia, dysuria, flank pain, frequency and urgency.  Musculoskeletal:  Negative for arthralgias, back pain and myalgias.  Skin:  Negative for rash.  Allergic/Immunologic: Negative for environmental allergies.  Neurological:  Negative for dizziness, weakness and headaches.  Hematological:  Negative for adenopathy.  Psychiatric/Behavioral:  The patient is not nervous/anxious.       Objective     Today's Vitals   02/16/22 1320  BP: 115/67  Pulse: 81  SpO2: 99%  Weight: 168 lb 1.9 oz (76.3 kg)   Body mass index is 26.33 kg/m.  BP Readings from Last 3 Encounters:  02/16/22 115/67  01/19/22 109/74  01/05/22 135/85    Wt Readings from Last 3 Encounters:  02/16/22 168 lb 1.9 oz (76.3 kg)  01/19/22 176 lb 6.4 oz (80 kg)  01/05/22 176 lb (79.8 kg)    Physical Exam Vitals and nursing note reviewed.  Constitutional:      Appearance: Normal appearance. She is well-developed.  HENT:     Head: Normocephalic and atraumatic.     Nose: Nose normal.     Mouth/Throat:     Mouth: Mucous membranes are moist.     Pharynx: Oropharynx is clear.  Eyes:     Extraocular Movements: Extraocular movements intact.     Conjunctiva/sclera: Conjunctivae normal.     Pupils: Pupils are equal, round, and reactive to light.  Cardiovascular:     Rate and Rhythm: Normal rate and regular rhythm.     Pulses: Normal pulses.     Heart sounds:  Normal heart sounds.  Pulmonary:     Effort: Pulmonary effort is normal.     Breath sounds: Normal breath sounds.  Abdominal:     Palpations: Abdomen is soft.  Musculoskeletal:        General: Normal range of motion.     Cervical back: Normal range of motion and neck supple.  Lymphadenopathy:     Cervical: No cervical adenopathy.  Skin:    General: Skin is warm and dry.     Capillary Refill: Capillary refill takes less than 2 seconds.  Neurological:     General: No focal deficit present.     Mental Status: She is alert and oriented to  person, place, and time.  Psychiatric:        Mood and Affect: Mood normal.        Behavior: Behavior normal.        Thought Content: Thought content normal.        Judgment: Judgment normal.       Assessment & Plan    1. Vitamin B12 deficiency Vitamin b21 injection administered during today's visit.  - cyanocobalamin (VITAMIN B12) injection 1,000 mcg  2. BMI 27.0-27.9,adult Improving with 10 pound weight loss since last visit. May continue phentermine 37.5 mg daily. Continue to limit calorie intake to 1500 calories per day and begin incorporating exercise into daily routine to help lose weight. Will monitor.  - phentermine (ADIPEX-P) 37.5 MG tablet; Take 1 tablet (37.5 mg total) by mouth daily before breakfast.  Dispense: 30 tablet; Refill: 1    Problem List Items Addressed This Visit       Other   Vitamin B12 deficiency - Primary   BMI 27.0-27.9,adult   Relevant Medications   phentermine (ADIPEX-P) 37.5 MG tablet     Return in about 2 months (around 04/18/2022) for routine - weight management.         Ronnell Freshwater, NP  [facility] at [facility] [phone] (phone) [phone] (fax)  [place]

## 2022-02-19 ENCOUNTER — Other Ambulatory Visit: Payer: Self-pay

## 2022-02-22 NOTE — Progress Notes (Signed)
Negative screening mammogram. Repeat in one year

## 2022-03-19 ENCOUNTER — Other Ambulatory Visit: Payer: Self-pay

## 2022-04-18 NOTE — Progress Notes (Unsigned)
Established patient visit   Patient: Erica Franco   DOB: 06/19/1970   51 y.o. Female  MRN: [id number] Visit Date: 04/19/2022   No chief complaint on file.  Subjective    HPI  Follow up weight management  -started phentermine at last visit 01/19/2022 -initial weight 01/19/2022 - 176 pounds  -most recent weight - 02/16/2022 - 168 pounds  -today's weight - 04/19/2022 -  -weight loss since last visit - 10 pounds  Total weight loss since starting phentermine -  Negative side effects from taking phentermine - did notice some mild constipation and dry mouth  In addition to phentermine, she has also cut her sugar intake way back. She is eating smaller portions of food. Is not eating after 7 pm.  She has not started exercising yet. This is goal prior to next visit  She states that her overall goal is to lose an additional 15 pounds  She would also like to get B12 injection.    Medications: Outpatient Medications Prior to Visit  Medication Sig   ergocalciferol (DRISDOL) 1.25 MG (50000 UT) capsule Take 1 capsule (50,000 Units total) by mouth once a week.   phentermine (ADIPEX-P) 37.5 MG tablet Take 1 tablet (37.5 mg total) by mouth daily before breakfast.   No facility-administered medications prior to visit.    Review of Systems  Last CBC Lab Results  Component Value Date   WBC 3.8 01/05/2022   HGB 14.0 01/05/2022   HCT 40.9 01/05/2022   MCV 87 01/05/2022   MCH 29.9 01/05/2022   RDW 13.0 01/05/2022   PLT 227 89/21/1941   Last metabolic panel Lab Results  Component Value Date   GLUCOSE 85 01/05/2022   NA 141 01/05/2022   K 4.5 01/05/2022   CL 102 01/05/2022   CO2 25 01/05/2022   BUN 15 01/05/2022   CREATININE 0.91 01/05/2022   EGFR 77 01/05/2022   CALCIUM 9.5 01/05/2022   PROT 7.0 01/05/2022   ALBUMIN 4.6 01/05/2022   LABGLOB 2.4 01/05/2022   AGRATIO 1.9 01/05/2022   BILITOT 1.6 (H) 01/05/2022   ALKPHOS 71 01/05/2022   AST 23 01/05/2022   ALT 20 01/05/2022    Last lipids Lab Results  Component Value Date   CHOL 215 (H) 01/05/2022   HDL 53 01/05/2022   LDLCALC 151 (H) 01/05/2022   TRIG 65 01/05/2022   CHOLHDL 4.1 01/05/2022   Last hemoglobin A1c Lab Results  Component Value Date   HGBA1C 5.5 01/05/2022   Last thyroid functions Lab Results  Component Value Date   TSH 1.840 01/05/2022   Last vitamin D Lab Results  Component Value Date   VD25OH 28.3 (L) 01/05/2022       Objective    There were no vitals filed for this visit. There is no height or weight on file to calculate BMI.  BP Readings from Last 3 Encounters:  02/16/22 115/67  01/19/22 109/74  01/05/22 135/85    Wt Readings from Last 3 Encounters:  02/16/22 168 lb 1.9 oz (76.3 kg)  01/19/22 176 lb 6.4 oz (80 kg)  01/05/22 176 lb (79.8 kg)    Physical Exam  ***  No results found for any visits on 04/19/22.  Assessment & Plan     Problem List Items Addressed This Visit   None    No follow-ups on file.         Ronnell Freshwater, NP  [facility] at [facility] [phone] (phone) [phone] (fax)   [facility]  

## 2022-04-19 ENCOUNTER — Encounter: Payer: Self-pay | Admitting: Nurse Practitioner

## 2022-04-19 ENCOUNTER — Ambulatory Visit (INDEPENDENT_AMBULATORY_CARE_PROVIDER_SITE_OTHER): Payer: No Typology Code available for payment source | Admitting: Nurse Practitioner

## 2022-04-19 ENCOUNTER — Other Ambulatory Visit: Payer: Self-pay

## 2022-04-19 VITALS — BP 137/83 | HR 75 | Resp 18 | Ht 67.0 in | Wt 161.0 lb

## 2022-04-19 DIAGNOSIS — E559 Vitamin D deficiency, unspecified: Secondary | ICD-10-CM | POA: Diagnosis not present

## 2022-04-19 DIAGNOSIS — Z6827 Body mass index (BMI) 27.0-27.9, adult: Secondary | ICD-10-CM

## 2022-04-19 MED ORDER — PHENTERMINE HCL 37.5 MG PO TABS
37.5000 mg | ORAL_TABLET | Freq: Every day | ORAL | 1 refills | Status: DC
  Filled 2022-04-19: qty 30, 30d supply, fill #0

## 2022-06-23 ENCOUNTER — Other Ambulatory Visit: Payer: Self-pay

## 2022-07-15 ENCOUNTER — Telehealth: Payer: 59 | Admitting: Physician Assistant

## 2022-07-15 DIAGNOSIS — B9689 Other specified bacterial agents as the cause of diseases classified elsewhere: Secondary | ICD-10-CM | POA: Diagnosis not present

## 2022-07-15 DIAGNOSIS — J019 Acute sinusitis, unspecified: Secondary | ICD-10-CM | POA: Diagnosis not present

## 2022-07-15 MED ORDER — DOXYCYCLINE HYCLATE 100 MG PO TABS: 100.0000 mg | ORAL_TABLET | Freq: Two times a day (BID) | ORAL | 0 refills | Status: DC

## 2022-07-15 MED ORDER — FLUTICASONE PROPIONATE 50 MCG/ACT NA SUSP: 2.0000 | Freq: Every day | NASAL | 0 refills | Status: AC

## 2022-07-15 NOTE — Progress Notes (Signed)
Virtual Visit Consent   Erica Franco, you are scheduled for a virtual visit with a [facility] provider today. Just as with appointments in the office, your consent must be obtained to participate. Your consent will be active for this visit and any virtual visit you may have with one of our providers in the next 365 days. If you have a MyChart account, a copy of this consent can be sent to you electronically.  As this is a virtual visit, video technology does not allow for your provider to perform a traditional examination. This may limit your provider's ability to fully assess your condition. If your provider identifies any concerns that need to be evaluated in person or the need to arrange testing (such as labs, EKG, etc.), we will make arrangements to do so. Although advances in technology are sophisticated, we cannot ensure that it will always work on either your end or our end. If the connection with a video visit is poor, the visit may have to be switched to a telephone visit. With either a video or telephone visit, we are not always able to ensure that we have a secure connection.  By engaging in this virtual visit, you consent to the provision of healthcare and authorize for your insurance to be billed (if applicable) for the services provided during this visit. Depending on your insurance coverage, you may receive a charge related to this service.  I need to obtain your verbal consent now. Are you willing to proceed with your visit today? Erica Franco has provided verbal consent on 07/15/2022 for a virtual visit (video or telephone). Leeanne Rio, [place]  Date: 07/15/2022 1:51 PM  Virtual Visit via Video Note   I, Leeanne Rio, connected with  Erica Franco  ([id number], 1971/04/23) on 07/15/22 at  1:45 PM EST by a video-enabled telemedicine application and verified that I am speaking with the correct person using two identifiers.  Location: Patient: Virtual Visit  Location Patient: Home Provider: Virtual Visit Location Provider: Home Office   I discussed the limitations of evaluation and management by telemedicine and the availability of in person appointments. The patient expressed understanding and agreed to proceed.    History of Present Illness: Erica Franco is a 52 y.o. who identifies as a female who was assigned female at birth, and is being seen today for URI symptoms starting a week ago with headache, nasal and sinus pressure. Initially intermittent but now more constant. Yesterday with increased sinus pressure and now sinus pain. Nasal discharge that is now thick and green.   OTC -- Advil, Alka Seltzer +.  HPI: HPI  Problems:  Patient Active Problem List   Diagnosis Date Noted   Vitamin B12 deficiency 01/19/2022   BMI 27.0-27.9,adult 01/19/2022   Vitamin D deficiency 01/05/2022   Absolute anemia 01/05/2022   Low ferritin 01/05/2022   Encounter for general adult medical examination with abnormal findings 11/10/2020   Other fatigue 11/10/2020   Healthcare maintenance 11/10/2020   Celiac disease/ gluten sensitivity 06/26/2018   Body mass index (BMI) of 25.0 to 29.9 06/26/2018   Environmental and seasonal allergies 06/26/2018   Family history of colon cancer 06/26/2018   Family history of heart attack 06/26/2018   Family history of diabetes mellitus (DM) 06/26/2018   Strain of knee 04/14/2016    Allergies:  Allergies  Allergen Reactions   Gluten Meal Other (See Comments)   Medications:  Current Outpatient Medications:    doxycycline (VIBRA-TABS) 100  MG tablet, Take 1 tablet (100 mg total) by mouth 2 (two) times daily., Disp: 20 tablet, Rfl: 0   fluticasone (FLONASE) 50 MCG/ACT nasal spray, Place 2 sprays into both nostrils daily., Disp: 16 g, Rfl: 0  Observations/Objective: Patient is well-developed, well-nourished in no acute distress.  Resting comfortably at home.  Head is normocephalic, atraumatic.  No labored  breathing. Speech is clear and coherent with logical content.  Patient is alert and oriented at baseline.   Assessment and Plan: 1. Acute bacterial sinusitis - doxycycline (VIBRA-TABS) 100 MG tablet; Take 1 tablet (100 mg total) by mouth 2 (two) times daily.  Dispense: 20 tablet; Refill: 0 - fluticasone (FLONASE) 50 MCG/ACT nasal spray; Place 2 sprays into both nostrils daily.  Dispense: 16 g; Refill: 0  Rx Doxycycline.  Increase fluids.  Rest.  Saline nasal spray.  Probiotic.  Mucinex as directed.  Humidifier in bedroom. Flonase per orders.  Call or return to clinic if symptoms are not improving.   Follow Up Instructions: I discussed the assessment and treatment plan with the patient. The patient was provided an opportunity to ask questions and all were answered. The patient agreed with the plan and demonstrated an understanding of the instructions.  A copy of instructions were sent to the patient via MyChart unless otherwise noted below.   The patient was advised to call back or seek an in-person evaluation if the symptoms worsen or if the condition fails to improve as anticipated.  Time:  I spent 8 minutes with the patient via telehealth technology discussing the above problems/concerns.    Leeanne Rio, PA-C

## 2022-07-15 NOTE — Patient Instructions (Signed)
Erica Franco, thank you for joining Leeanne Rio, PA-C for today's virtual visit.  While this provider is not your primary care provider (PCP), if your PCP is located in our provider database this encounter information will be shared with them immediately following your visit.   [facility] account gives you access to today's visit and all your visits, tests, and labs performed at [facility] " click here if you don't have a [facility] account or go to [email or web address]  Consent: (Patient) Erica Franco provided verbal consent for this virtual visit at the beginning of the encounter.  Current Medications:  Current Outpatient Medications:    ergocalciferol (DRISDOL) 1.25 MG (50000 UT) capsule, Take 1 capsule (50,000 Units total) by mouth once a week. (Patient not taking: Reported on 04/19/2022), Disp: 4 capsule, Rfl: 3   phentermine (ADIPEX-P) 37.5 MG tablet, Take 1 tablet (37.5 mg total) by mouth daily before breakfast., Disp: 30 tablet, Rfl: 1   Medications ordered in this encounter:  No orders of the defined types were placed in this encounter.    *If you need refills on other medications prior to your next appointment, please contact your pharmacy*  Follow-Up: Call back or seek an in-person evaluation if the symptoms worsen or if the condition fails to improve as anticipated.  [place] [phone]  Other Instructions Please take antibiotic as directed.  Increase fluid intake.  Use Saline nasal spray.  Take a daily multivitamin. Use the Flonase as directed.  Place a humidifier in the bedroom.  Please call or return clinic if symptoms are not improving.  Sinusitis Sinusitis is redness, soreness, and swelling (inflammation) of the paranasal sinuses. Paranasal sinuses are air pockets within the bones of your face (beneath the eyes, the middle of the forehead, or above the eyes). In healthy paranasal sinuses,  mucus is able to drain out, and air is able to circulate through them by way of your nose. However, when your paranasal sinuses are inflamed, mucus and air can become trapped. This can allow bacteria and other germs to grow and cause infection. Sinusitis can develop quickly and last only a short time (acute) or continue over a long period (chronic). Sinusitis that lasts for more than 12 weeks is considered chronic.  CAUSES  Causes of sinusitis include: Allergies. Structural abnormalities, such as displacement of the cartilage that separates your nostrils (deviated septum), which can decrease the air flow through your nose and sinuses and affect sinus drainage. Functional abnormalities, such as when the small hairs (cilia) that line your sinuses and help remove mucus do not work properly or are not present. SYMPTOMS  Symptoms of acute and chronic sinusitis are the same. The primary symptoms are pain and pressure around the affected sinuses. Other symptoms include: Upper toothache. Earache. Headache. Bad breath. Decreased sense of smell and taste. A cough, which worsens when you are lying flat. Fatigue. Fever. Thick drainage from your nose, which often is green and may contain pus (purulent). Swelling and warmth over the affected sinuses. DIAGNOSIS  Your caregiver will perform a physical exam. During the exam, your caregiver may: Look in your nose for signs of abnormal growths in your nostrils (nasal polyps). Tap over the affected sinus to check for signs of infection. View the inside of your sinuses (endoscopy) with a special imaging device with a light attached (endoscope), which is inserted into your sinuses. If your caregiver suspects that you have chronic sinusitis, one or more  of the following tests may be recommended: Allergy tests. Nasal culture A sample of mucus is taken from your nose and sent to a lab and screened for bacteria. Nasal cytology A sample of mucus is taken from your  nose and examined by your caregiver to determine if your sinusitis is related to an allergy. TREATMENT  Most cases of acute sinusitis are related to a viral infection and will resolve on their own within 10 days. Sometimes medicines are prescribed to help relieve symptoms (pain medicine, decongestants, nasal steroid sprays, or saline sprays).  However, for sinusitis related to a bacterial infection, your caregiver will prescribe antibiotic medicines. These are medicines that will help kill the bacteria causing the infection.  Rarely, sinusitis is caused by a fungal infection. In theses cases, your caregiver will prescribe antifungal medicine. For some cases of chronic sinusitis, surgery is needed. Generally, these are cases in which sinusitis recurs more than 3 times per year, despite other treatments. HOME CARE INSTRUCTIONS  Drink plenty of water. Water helps thin the mucus so your sinuses can drain more easily. Use a humidifier. Inhale steam 3 to 4 times a day (for example, sit in the bathroom with the shower running). Apply a warm, moist washcloth to your face 3 to 4 times a day, or as directed by your caregiver. Use saline nasal sprays to help moisten and clean your sinuses. Take over-the-counter or prescription medicines for pain, discomfort, or fever only as directed by your caregiver. SEEK IMMEDIATE MEDICAL CARE IF: You have increasing pain or severe headaches. You have nausea, vomiting, or drowsiness. You have swelling around your face. You have vision problems. You have a stiff neck. You have difficulty breathing. MAKE SURE YOU:  Understand these instructions. Will watch your condition. Will get help right away if you are not doing well or get worse. Document Released: 04/26/2005 Document Revised: 07/19/2011 Document Reviewed: 05/11/2011 [facility] Patient Information 2014 [place], [place].    If you have been instructed to have an in-person evaluation today at a local Urgent Care  facility, please use the link below. It will take you to a list of all of our available [facility], including address, phone number and hours of operation. Please do not delay care.  [facility]  If you or a family member do not have a primary care provider, use the link below to schedule a visit and establish care. When you choose a [facility] primary care physician or advanced practice provider, you gain a long-term partner in health. Find a Primary Care Provider  Learn more about 's in-office and virtual care options: [facility]

## 2023-01-11 ENCOUNTER — Other Ambulatory Visit: Payer: 59

## 2023-01-11 ENCOUNTER — Other Ambulatory Visit: Payer: Self-pay

## 2023-01-11 DIAGNOSIS — R5383 Other fatigue: Secondary | ICD-10-CM | POA: Diagnosis not present

## 2023-01-11 DIAGNOSIS — Z833 Family history of diabetes mellitus: Secondary | ICD-10-CM | POA: Diagnosis not present

## 2023-01-11 DIAGNOSIS — Z0000 Encounter for general adult medical examination without abnormal findings: Secondary | ICD-10-CM

## 2023-01-12 LAB — CBC WITH DIFFERENTIAL/PLATELET
Basophils Absolute: 0 10*3/uL (ref 0.0–0.2)
Basos: 1 %
EOS (ABSOLUTE): 0.1 10*3/uL (ref 0.0–0.4)
Eos: 2 %
Hematocrit: 41 % (ref 34.0–46.6)
Hemoglobin: 13.4 g/dL (ref 11.1–15.9)
Immature Grans (Abs): 0 10*3/uL (ref 0.0–0.1)
Immature Granulocytes: 0 %
Lymphocytes Absolute: 1.6 10*3/uL (ref 0.7–3.1)
Lymphs: 32 %
MCH: 29.3 pg (ref 26.6–33.0)
MCHC: 32.7 g/dL (ref 31.5–35.7)
MCV: 90 fL (ref 79–97)
Monocytes Absolute: 0.4 10*3/uL (ref 0.1–0.9)
Monocytes: 7 %
Neutrophils Absolute: 2.8 10*3/uL (ref 1.4–7.0)
Neutrophils: 58 %
Platelets: 194 10*3/uL (ref 150–450)
RBC: 4.57 x10E6/uL (ref 3.77–5.28)
RDW: 12.2 % (ref 11.7–15.4)
WBC: 4.9 10*3/uL (ref 3.4–10.8)

## 2023-01-12 LAB — COMPREHENSIVE METABOLIC PANEL
ALT: 21 IU/L (ref 0–32)
AST: 21 IU/L (ref 0–40)
Albumin: 4.5 g/dL (ref 3.8–4.9)
Alkaline Phosphatase: 86 IU/L (ref 44–121)
BUN/Creatinine Ratio: 12 (ref 9–23)
BUN: 12 mg/dL (ref 6–24)
Bilirubin Total: 0.9 mg/dL (ref 0.0–1.2)
CO2: 26 mmol/L (ref 20–29)
Calcium: 9.7 mg/dL (ref 8.7–10.2)
Chloride: 105 mmol/L (ref 96–106)
Creatinine, Ser: 1.03 mg/dL — ABNORMAL HIGH (ref 0.57–1.00)
Globulin, Total: 2.3 g/dL (ref 1.5–4.5)
Glucose: 89 mg/dL (ref 70–99)
Potassium: 4.8 mmol/L (ref 3.5–5.2)
Sodium: 142 mmol/L (ref 134–144)
Total Protein: 6.8 g/dL (ref 6.0–8.5)
eGFR: 66 mL/min/{1.73_m2} (ref >59)

## 2023-01-12 LAB — TSH: TSH: 2.1 u[IU]/mL (ref 0.450–4.500)

## 2023-01-12 LAB — HEMOGLOBIN A1C
Est. average glucose Bld gHb Est-mCnc: 123 mg/dL
Hgb A1c MFr Bld: 5.9 % — ABNORMAL HIGH (ref 4.8–5.6)

## 2023-01-12 LAB — LIPID PANEL
Chol/HDL Ratio: 4 ratio (ref 0.0–4.4)
Cholesterol, Total: 218 mg/dL — ABNORMAL HIGH (ref 100–199)
HDL: 54 mg/dL (ref >39)
LDL Chol Calc (NIH): 152 mg/dL — ABNORMAL HIGH (ref 0–99)
Triglycerides: 67 mg/dL (ref 0–149)
VLDL Cholesterol Cal: 12 mg/dL (ref 5–40)

## 2023-01-18 ENCOUNTER — Ambulatory Visit (INDEPENDENT_AMBULATORY_CARE_PROVIDER_SITE_OTHER): Payer: 59 | Admitting: Family Medicine

## 2023-01-18 ENCOUNTER — Encounter: Payer: Self-pay | Admitting: Family Medicine

## 2023-01-18 VITALS — BP 134/80 | HR 71 | Ht 67.0 in | Wt 174.0 lb

## 2023-01-18 DIAGNOSIS — Z1211 Encounter for screening for malignant neoplasm of colon: Secondary | ICD-10-CM

## 2023-01-18 DIAGNOSIS — R7303 Prediabetes: Secondary | ICD-10-CM | POA: Insufficient documentation

## 2023-01-18 DIAGNOSIS — E782 Mixed hyperlipidemia: Secondary | ICD-10-CM | POA: Diagnosis not present

## 2023-01-18 DIAGNOSIS — E785 Hyperlipidemia, unspecified: Secondary | ICD-10-CM | POA: Insufficient documentation

## 2023-01-18 DIAGNOSIS — Z1212 Encounter for screening for malignant neoplasm of rectum: Secondary | ICD-10-CM | POA: Diagnosis not present

## 2023-01-18 NOTE — Patient Instructions (Signed)
It was nice to see you today,  We addressed the following topics today: -I have put in a referral for the colonoscopy and noted for to be sent to a different location - To help improve your cholesterol level, you can eat a diet with less saturated fat.  Saturated fats are found in red meat, fatty meat, fried foods, dairy products such as butter and cheese. - Follow-up in 1 year or sooner if needed.  Have a great day,  Frederic Jericho, MD

## 2023-01-18 NOTE — Progress Notes (Unsigned)
   Annual physical  Subjective   Patient ID: Erica Franco, female    DOB: 1970/08/08  Age: 52 y.o. MRN: [id number]  Chief Complaint  Patient presents with   Annual Exam   HPI Erica Franco is a 53 y.o. old female here  for annual exam.   Changes in his/her health in the last 12 months: no  The patient currently works as a MRI.  She is married to female partner  and has  3 children.  She does not use tobacco, alcohol or recreational drugs. The patient eats a gluten free diet.    The patient does not have regular periods since having an endometrial ablation 3 years ago.   The patient does not have a family history of breast/ovarian/endometrial carcinoma.    Patient would like a referral for a colonoscopy.  She had molested states that it was not scheduled by the gastroenterologist office when she could not obtain records of her previous upper endoscopy from greater than 10 years ago when she was diagnosed with celiac disease.Erica Franco it was Dr. Hulen Shouts office.  We discussed the patient's cholesterol levels and her LDL and total cholesterol being elevated.  Discussed dietary and lifestyle modifications.  Discussed her A1c being in the prediabetic range now.  The 10-year ASCVD risk score (Arnett DK, et al., 2019) is: 1.7%   HPI    The 10-year ASCVD risk score (Arnett DK, et al., 2019) is: 1.7%  Health Maintenance Due  Topic Date Due   HIV Screening  Never done   Hepatitis C Screening  Never done   PAP SMEAR-Modifier  Never done   Colonoscopy  Never done   Zoster Vaccines- Shingrix (1 of 2) Never done   COVID-19 Vaccine (3 - 2023-24 season) 01/09/2023      Objective:     BP 134/80   Pulse 71   Ht 5\' 7"  (1.702 m)   Wt 174 lb (78.9 kg)   SpO2 99%   BMI 27.25 kg/m  {Vitals History (Optional):23777}  Physical Exam General: Alert, oriented H&T: PERRLA, EOMI, moist mucosa CV: Regular rate and rhythm Pulmonary: Lungs clear bilaterally GI: Soft,  nontender Extremity: No pedal edema MSK: Strength equal bilaterally Skin: Warm dry, no rashes Psych: Pleasant affect   No results found for any visits on 01/18/23.      Assessment & Plan:   Encounter for colorectal cancer screening -     Ambulatory referral to Gastroenterology  Moderate mixed hyperlipidemia not requiring statin therapy Assessment & Plan: Discussed dietary and lifestyle modifications.The 10-year ASCVD risk score (Arnett DK, et al., 2019) is: 1.7% Follow-up lipid panel in 1 year.   Prediabetes Assessment & Plan: A1c 5.9 after previously being 5.5 the last several years.  Discussed dietary modifications including limiting starches, breads, pastas, rice, sugary drinks.      Return in about 1 year (around 01/18/2024) for physical.    Sandre Kitty, MD

## 2023-01-18 NOTE — Assessment & Plan Note (Signed)
Discussed dietary and lifestyle modifications.The 10-year ASCVD risk score (Arnett DK, et al., 2019) is: 1.7% Follow-up lipid panel in 1 year.

## 2023-01-18 NOTE — Assessment & Plan Note (Signed)
A1c 5.9 after previously being 5.5 the last several years.  Discussed dietary modifications including limiting starches, breads, pastas, rice, sugary drinks.

## 2023-01-19 ENCOUNTER — Encounter: Payer: Self-pay | Admitting: Family Medicine

## 2023-02-11 ENCOUNTER — Other Ambulatory Visit: Payer: Self-pay | Admitting: Family Medicine

## 2023-02-11 DIAGNOSIS — Z1211 Encounter for screening for malignant neoplasm of colon: Secondary | ICD-10-CM

## 2023-02-11 DIAGNOSIS — Z1212 Encounter for screening for malignant neoplasm of rectum: Secondary | ICD-10-CM

## 2023-03-16 ENCOUNTER — Other Ambulatory Visit: Payer: Self-pay

## 2023-03-16 MED ORDER — PEG-3350/ELECTROLYTES 236 G PO SOLR
4000.0000 mL | Freq: Once | ORAL | 0 refills | Status: AC
  Filled 2023-03-16: qty 4000, 1d supply, fill #0

## 2023-03-16 MED ORDER — BISACODYL 5 MG PO TBEC
20.0000 mg | DELAYED_RELEASE_TABLET | ORAL | 0 refills | Status: DC
  Filled 2023-03-16: qty 4, 1d supply, fill #0

## 2023-03-22 DIAGNOSIS — Z1211 Encounter for screening for malignant neoplasm of colon: Secondary | ICD-10-CM | POA: Diagnosis not present

## 2023-03-22 DIAGNOSIS — K649 Unspecified hemorrhoids: Secondary | ICD-10-CM | POA: Diagnosis not present

## 2023-03-22 LAB — HM COLONOSCOPY

## 2023-04-05 ENCOUNTER — Encounter: Payer: Self-pay | Admitting: Gastroenterology

## 2023-05-06 ENCOUNTER — Telehealth (INDEPENDENT_AMBULATORY_CARE_PROVIDER_SITE_OTHER): Payer: 59 | Admitting: Family Medicine

## 2023-05-06 VITALS — Temp 99.5°F | Ht 67.0 in | Wt 174.0 lb

## 2023-05-06 DIAGNOSIS — J019 Acute sinusitis, unspecified: Secondary | ICD-10-CM | POA: Diagnosis not present

## 2023-05-06 DIAGNOSIS — B9689 Other specified bacterial agents as the cause of diseases classified elsewhere: Secondary | ICD-10-CM | POA: Diagnosis not present

## 2023-05-06 MED ORDER — AMOXICILLIN-POT CLAVULANATE 875-125 MG PO TABS: 1.0000 | ORAL_TABLET | Freq: Two times a day (BID) | ORAL | 0 refills | Status: AC

## 2023-05-06 NOTE — Progress Notes (Signed)
   Acute Office Visit  Subjective:     Patient ID: Erica Franco, female    DOB: Mar 30, 1971, 52 y.o.   MRN: [id number]  Chief Complaint  Patient presents with   Sinus Problem   Patient location: Home Provider location: [facility] primary care Telemedicine method: Video Duration of encounter: 21 minutes HPI Patient seen by video visit today for complaints of headache, sinus pressure, nasal congestion.  Patient states symptoms started 3 days ago on December 25.  Mostly sinus pressure and her forehead and around her eyes.  Nasal congestion with thick green nasal discharge.  No cough.  No sore throat.  Temperature was as high as 99.5.  No chills.  Husband was recently sick with cough.  Patient taking Alka-Seltzer plus but no other medications for it.  Has had issues with sinus infections in the past that have required antibiotics.  No allergies to antibiotics.   ROS      Objective:    Temp 99.5 F (37.5 C)   Ht 5\' 7"  (1.702 m)   Wt 174 lb (78.9 kg)   BMI 27.25 kg/m    Physical Exam General: Alert, oriented.  Mildly ill-appearing HEENT: Congested sounding.  No results found for any visits on 05/06/23.      Assessment & Plan:   Acute bacterial sinusitis Assessment & Plan: Patient with 3 days of symptoms, but given the fact her symptoms seem to be isolated to her sinuses and her history of needing antibiotics for sinus infections we prescribed Augmentin for 5 days.  Discussed symptomatic treatment with Afrin nasal spray, nasal saline spray, Flonase which she already uses.   Other orders -     Amoxicillin-Pot Clavulanate; Take 1 tablet by mouth 2 (two) times daily for 5 days.  Dispense: 10 tablet; Refill: 0     No follow-ups on file.  Sandre Kitty, MD

## 2023-05-06 NOTE — Assessment & Plan Note (Signed)
Patient with 3 days of symptoms, but given the fact her symptoms seem to be isolated to her sinuses and her history of needing antibiotics for sinus infections we prescribed Augmentin for 5 days.  Discussed symptomatic treatment with Afrin nasal spray, nasal saline spray, Flonase which she already uses.

## 2023-06-13 ENCOUNTER — Other Ambulatory Visit: Payer: Self-pay | Admitting: Family Medicine

## 2023-06-13 ENCOUNTER — Encounter: Payer: Self-pay | Admitting: Family Medicine

## 2023-06-13 DIAGNOSIS — Z1231 Encounter for screening mammogram for malignant neoplasm of breast: Secondary | ICD-10-CM

## 2023-06-16 ENCOUNTER — Ambulatory Visit
Admission: RE | Admit: 2023-06-16 | Discharge: 2023-06-16 | Disposition: A | Discharge status: Home / Self Care | Payer: 59 | Source: Ambulatory Visit | Attending: Family Medicine | Admitting: Family Medicine

## 2023-06-16 DIAGNOSIS — Z1231 Encounter for screening mammogram for malignant neoplasm of breast: Secondary | ICD-10-CM | POA: Diagnosis not present

## 2023-09-05 DIAGNOSIS — H524 Presbyopia: Secondary | ICD-10-CM | POA: Diagnosis not present

## 2024-01-12 ENCOUNTER — Other Ambulatory Visit: Payer: 59

## 2024-01-16 ENCOUNTER — Other Ambulatory Visit: Payer: Self-pay | Admitting: *Deleted

## 2024-01-16 DIAGNOSIS — E559 Vitamin D deficiency, unspecified: Secondary | ICD-10-CM

## 2024-01-16 DIAGNOSIS — R7303 Prediabetes: Secondary | ICD-10-CM

## 2024-01-16 DIAGNOSIS — E782 Mixed hyperlipidemia: Secondary | ICD-10-CM

## 2024-01-16 DIAGNOSIS — Z6827 Body mass index (BMI) 27.0-27.9, adult: Secondary | ICD-10-CM

## 2024-01-17 ENCOUNTER — Other Ambulatory Visit: Payer: 59

## 2024-01-17 DIAGNOSIS — Z6827 Body mass index (BMI) 27.0-27.9, adult: Secondary | ICD-10-CM

## 2024-01-17 DIAGNOSIS — R7303 Prediabetes: Secondary | ICD-10-CM

## 2024-01-17 DIAGNOSIS — E559 Vitamin D deficiency, unspecified: Secondary | ICD-10-CM | POA: Diagnosis not present

## 2024-01-17 DIAGNOSIS — E782 Mixed hyperlipidemia: Secondary | ICD-10-CM

## 2024-01-18 ENCOUNTER — Ambulatory Visit: Payer: Self-pay | Admitting: Family Medicine

## 2024-01-18 LAB — COMPREHENSIVE METABOLIC PANEL WITH GFR
ALT: 20 IU/L (ref 0–32)
AST: 20 IU/L (ref 0–40)
Albumin: 4.9 g/dL (ref 3.8–4.9)
Alkaline Phosphatase: 90 IU/L (ref 44–121)
BUN/Creatinine Ratio: 14 (ref 9–23)
BUN: 14 mg/dL (ref 6–24)
Bilirubin Total: 1.1 mg/dL (ref 0.0–1.2)
CO2: 23 mmol/L (ref 20–29)
Calcium: 10.2 mg/dL (ref 8.7–10.2)
Chloride: 103 mmol/L (ref 96–106)
Creatinine, Ser: 1.01 mg/dL — ABNORMAL HIGH (ref 0.57–1.00)
Globulin, Total: 2.3 g/dL (ref 1.5–4.5)
Glucose: 89 mg/dL (ref 70–99)
Potassium: 4.5 mmol/L (ref 3.5–5.2)
Sodium: 143 mmol/L (ref 134–144)
Total Protein: 7.2 g/dL (ref 6.0–8.5)
eGFR: 67 mL/min/1.73 (ref >59)

## 2024-01-18 LAB — CBC WITH DIFFERENTIAL/PLATELET
Basophils Absolute: 0 x10E3/uL (ref 0.0–0.2)
Basos: 1 %
EOS (ABSOLUTE): 0.1 x10E3/uL (ref 0.0–0.4)
Eos: 2 %
Hematocrit: 42.4 % (ref 34.0–46.6)
Hemoglobin: 14.5 g/dL (ref 11.1–15.9)
Immature Grans (Abs): 0 x10E3/uL (ref 0.0–0.1)
Immature Granulocytes: 0 %
Lymphocytes Absolute: 1.7 x10E3/uL (ref 0.7–3.1)
Lymphs: 38 %
MCH: 31 pg (ref 26.6–33.0)
MCHC: 34.2 g/dL (ref 31.5–35.7)
MCV: 91 fL (ref 79–97)
Monocytes Absolute: 0.3 x10E3/uL (ref 0.1–0.9)
Monocytes: 7 %
Neutrophils Absolute: 2.3 x10E3/uL (ref 1.4–7.0)
Neutrophils: 52 %
Platelets: 214 x10E3/uL (ref 150–450)
RBC: 4.67 x10E6/uL (ref 3.77–5.28)
RDW: 12.8 % (ref 11.7–15.4)
WBC: 4.5 x10E3/uL (ref 3.4–10.8)

## 2024-01-18 LAB — VITAMIN D 25 HYDROXY (VIT D DEFICIENCY, FRACTURES): Vit D, 25-Hydroxy: 39 ng/mL (ref 30.0–100.0)

## 2024-01-18 LAB — LIPID PANEL
Chol/HDL Ratio: 4.2 ratio (ref 0.0–4.4)
Cholesterol, Total: 237 mg/dL — ABNORMAL HIGH (ref 100–199)
HDL: 57 mg/dL (ref >39)
LDL Chol Calc (NIH): 164 mg/dL — ABNORMAL HIGH (ref 0–99)
Triglycerides: 92 mg/dL (ref 0–149)
VLDL Cholesterol Cal: 16 mg/dL (ref 5–40)

## 2024-01-18 LAB — HEMOGLOBIN A1C
Est. average glucose Bld gHb Est-mCnc: 111 mg/dL
Hgb A1c MFr Bld: 5.5 % (ref 4.8–5.6)

## 2024-01-18 LAB — TSH: TSH: 2.64 u[IU]/mL (ref 0.450–4.500)

## 2024-01-19 ENCOUNTER — Encounter: Payer: 59 | Admitting: Family Medicine

## 2024-01-24 ENCOUNTER — Encounter: Payer: Self-pay | Admitting: Family Medicine

## 2024-01-24 ENCOUNTER — Other Ambulatory Visit: Payer: Self-pay

## 2024-01-24 ENCOUNTER — Ambulatory Visit (INDEPENDENT_AMBULATORY_CARE_PROVIDER_SITE_OTHER): Payer: 59 | Admitting: Family Medicine

## 2024-01-24 VITALS — BP 138/86 | HR 68 | Ht 67.0 in | Wt 168.0 lb

## 2024-01-24 DIAGNOSIS — Z6827 Body mass index (BMI) 27.0-27.9, adult: Secondary | ICD-10-CM | POA: Diagnosis not present

## 2024-01-24 DIAGNOSIS — E782 Mixed hyperlipidemia: Secondary | ICD-10-CM

## 2024-01-24 DIAGNOSIS — Z0000 Encounter for general adult medical examination without abnormal findings: Secondary | ICD-10-CM

## 2024-01-24 MED ORDER — PHENTERMINE HCL 37.5 MG PO CAPS
37.5000 mg | ORAL_CAPSULE | ORAL | 2 refills | Status: AC
  Filled 2024-01-24: qty 30, 30d supply, fill #0
  Filled 2024-03-14 – 2024-04-24 (×2): qty 30, 30d supply, fill #1
  Filled 2024-06-14: qty 30, 30d supply, fill #2

## 2024-01-24 NOTE — Assessment & Plan Note (Signed)
-   Recent labs show elevated cholesterol, similar to previous results. Total cholesterol 237, LDL 164. - Low overall risk for cardiovascular events given lack of smoking history and no significant early family history of heart disease. - Discussed non-pharmacological management options including weight loss, regular exercise, and limiting saturated fats (red meat, dairy, butter). - Discussed supplements that may help lower cholesterol: fiber (Metamucil), CoQ10, red yeast rice, and plant sterols. - Advised that fish oil is for triglycerides and can raise LDL. - Discussed future risk stratification options if desired, including ApoB, Lp(a), and coronary artery calcium score. For now, no medication is indicated.

## 2024-01-24 NOTE — Assessment & Plan Note (Signed)
-   Colonoscopy up to date. - Mammogram up to date. - Advised regarding Pap smears; can be done every 5 years if normal, and screening stops at age 52. Encouraged to follow up with gynecologist.

## 2024-01-24 NOTE — Assessment & Plan Note (Signed)
-   Seeking assistance with weight loss for an upcoming event. Previous successful use of phentermine  approximately two years ago. Weight today is 168 lbs, slightly lower than in December. - Prescribe phentermine  37.5 mg daily for 12 weeks (1 month with 2 refills). - Prescription sent to [facility] pharmacy as requested. - Counseled on potential pharmacy issues if BMI becomes normal and to message if there are problems with refills.

## 2024-01-24 NOTE — Patient Instructions (Signed)
 It was nice to see you today,  We addressed the following topics today: -I have sent in the phentermine  with 2 refills to last 3 months. - The following will help lower your cholesterol: Weight loss, regular exercise, limiting your saturated fat from red meat and dairy less than 10 g/day,.  Supplements over-the-counter include: Metamucil fiber, co-Q10, red yeast rice, plant sterols/Smart balance butter  Have a great day,  Rolan Slain, MD

## 2024-01-24 NOTE — Progress Notes (Unsigned)
 Annual physical  Subjective    Patient ID: Erica Franco, female    DOB: January 28, 1971  Age: 53 y.o. MRN: [id number]  Chief Complaint  Patient presents with   Annual Exam   HPI Erica Franco is a 53 y.o. old female here  for annual exam.   Subjective - Here for physical. Discussed weight loss medication. Inquires about medication to lose a few pounds for a wedding on 03/03/2024. Previously used phentermine  about two years ago with good tolerance.  - Discussed recent lab results. Reports not having reviewed them yet.  - Reports a new sensation of wetness in the ear, particularly in the morning upon waking. Feels like water coming out, possibly wax. No associated pain. It is not running out of the ear. Occurs when lying on the side.  Medications Discussed phentermine . Previously prescribed 37.5 mg. Tolerated well.  PMH, PSH, FH, Social Hx PMHx: High cholesterol. Gluten-free diet. PSHx: Colonoscopy was normal, no polyps found, follow-up in 10 years. Last mammogram this year. Last Pap smear was several years ago. FHx: Grandfather with heart issues, including heart attacks in his 70s or 80s. Father with history of stroke secondary to atrial fibrillation. Social Hx: Non-smoker. No alcohol use.  ROS HEENT: Reports sensation of wetness in ear, denies otalgia.     The 10-year ASCVD risk score (Arnett DK, et al., 2019) is: 2%  Health Maintenance Due  Topic Date Due   HIV Screening  Never done   Hepatitis C Screening  Never done   Hepatitis B Vaccines 19-59 Average Risk (1 of 3 - 19+ 3-dose series) Never done   Cervical Cancer Screening (HPV/Pap Cotest)  Never done   Fecal DNA (Cologuard)  Never done   COVID-19 Vaccine (3 - 2025-26 season) 01/09/2024      Objective:     BP 138/86   Pulse 68   Ht 5' 7 (1.702 m)   Wt 168 lb (76.2 kg)   SpO2 100%   BMI 26.31 kg/m    Physical Exam General: Alert, oriented HEENT: PERRLA, EOMI, mucous mucosal membrane.  Normal TM  bilaterally. CV: Regular rate and rhythm no murmurs Lungs bilaterally GI: Soft, nontender Extremities: No pedal edema Skin: Warm and dry Psych: Pleasant affect.   No results found for any visits on 01/24/24.      Assessment & Plan:   Physical exam, annual  Healthcare maintenance Assessment & Plan: - Colonoscopy up to date. - Mammogram up to date. - Advised regarding Pap smears; can be done every 5 years if normal, and screening stops at age 79. Encouraged to follow up with gynecologist.   Moderate mixed hyperlipidemia not requiring statin therapy Assessment & Plan: - Recent labs show elevated cholesterol, similar to previous results. Total cholesterol 237, LDL 164. - Low overall risk for cardiovascular events given lack of smoking history and no significant early family history of heart disease. - Discussed non-pharmacological management options including weight loss, regular exercise, and limiting saturated fats (red meat, dairy, butter). - Discussed supplements that may help lower cholesterol: fiber (Metamucil), CoQ10, red yeast rice, and plant sterols. - Advised that fish oil is for triglycerides and can raise LDL. - Discussed future risk stratification options if desired, including ApoB, Lp(a), and coronary artery calcium score. For now, no medication is indicated.   BMI 27.0-27.9,adult Assessment & Plan: - Seeking assistance with weight loss for an upcoming event. Previous successful use of phentermine  approximately two years ago. Weight today is 168 lbs, slightly lower than in  December. - Prescribe phentermine  37.5 mg daily for 12 weeks (1 month with 2 refills). - Prescription sent to [facility] pharmacy as requested. - Counseled on potential pharmacy issues if BMI becomes normal and to message if there are problems with refills.   Other orders -     Phentermine  HCl; Take 1 capsule (37.5 mg total) by mouth every morning.  Dispense: 30 capsule; Refill:  2     Return in about 1 year (around 01/23/2025) for physical.    Toribio MARLA Slain, MD

## 2024-01-31 ENCOUNTER — Encounter: Admitting: Family Medicine

## 2024-02-13 ENCOUNTER — Other Ambulatory Visit: Payer: Self-pay

## 2024-02-13 DIAGNOSIS — Z1212 Encounter for screening for malignant neoplasm of rectum: Secondary | ICD-10-CM

## 2024-02-13 DIAGNOSIS — Z1211 Encounter for screening for malignant neoplasm of colon: Secondary | ICD-10-CM

## 2024-03-14 ENCOUNTER — Other Ambulatory Visit: Payer: Self-pay

## 2024-03-27 ENCOUNTER — Other Ambulatory Visit: Payer: Self-pay

## 2024-06-14 ENCOUNTER — Other Ambulatory Visit: Payer: Self-pay

## 2025-01-28 ENCOUNTER — Encounter: Admitting: Family Medicine
